# Patient Record
Sex: Female | Born: 1940 | Race: White | Hispanic: No | Marital: Married | Smoking: Never smoker
Health system: Southern US, Community
[De-identification: ages and names within clinical notes are randomized; demographics above are authoritative.]

## PROBLEM LIST (undated history)

## (undated) DIAGNOSIS — K219 Gastro-esophageal reflux disease without esophagitis: Secondary | ICD-10-CM

## (undated) DIAGNOSIS — E039 Hypothyroidism, unspecified: Secondary | ICD-10-CM

## (undated) HISTORY — PX: JOINT REPLACEMENT: SHX530

## (undated) HISTORY — PX: OTHER SURGICAL HISTORY: SHX169

---

## 2019-12-18 ENCOUNTER — Ambulatory Visit: Payer: Self-pay | Admitting: General Surgery

## 2019-12-18 ENCOUNTER — Other Ambulatory Visit: Payer: Self-pay | Admitting: General Surgery

## 2019-12-18 ENCOUNTER — Telehealth: Payer: Self-pay

## 2019-12-18 ENCOUNTER — Other Ambulatory Visit: Payer: Self-pay

## 2019-12-18 DIAGNOSIS — R17 Unspecified jaundice: Secondary | ICD-10-CM

## 2019-12-18 DIAGNOSIS — K8689 Other specified diseases of pancreas: Secondary | ICD-10-CM

## 2019-12-18 DIAGNOSIS — C250 Malignant neoplasm of head of pancreas: Secondary | ICD-10-CM

## 2019-12-18 NOTE — Telephone Encounter (Signed)
930 am [facility] Dr Ardis Hughs EUS ERCP 12/21/19.  COVID testing 12/29.  For painless jaundice and pancreatic mass per VO Dr Ardis Hughs. Call Dr Osborne Casco (son) tomorrow with appt information.  [phone], he will also be dropping off [place] CT and ERCP tomorrow.

## 2019-12-19 ENCOUNTER — Other Ambulatory Visit (HOSPITAL_COMMUNITY)
Admission: RE | Admit: 2019-12-19 | Discharge: 2019-12-19 | Disposition: A | Discharge status: Home / Self Care | Payer: Medicare Other | Source: Ambulatory Visit | Attending: Gastroenterology | Admitting: Gastroenterology

## 2019-12-19 ENCOUNTER — Ambulatory Visit
Admission: RE | Admit: 2019-12-19 | Discharge: 2019-12-19 | Disposition: A | Discharge status: Home / Self Care | Payer: Medicare Other | Source: Ambulatory Visit | Attending: General Surgery | Admitting: General Surgery

## 2019-12-19 ENCOUNTER — Encounter (HOSPITAL_COMMUNITY): Payer: Self-pay | Admitting: Gastroenterology

## 2019-12-19 DIAGNOSIS — Z01812 Encounter for preprocedural laboratory examination: Secondary | ICD-10-CM | POA: Insufficient documentation

## 2019-12-19 DIAGNOSIS — Z20828 Contact with and (suspected) exposure to other viral communicable diseases: Secondary | ICD-10-CM | POA: Insufficient documentation

## 2019-12-19 DIAGNOSIS — C250 Malignant neoplasm of head of pancreas: Secondary | ICD-10-CM

## 2019-12-19 LAB — SARS CORONAVIRUS 2 (TAT 6-24 HRS): SARS Coronavirus 2: NEGATIVE

## 2019-12-19 MED ORDER — IOPAMIDOL (ISOVUE-300) INJECTION 61%
75.0000 mL | Freq: Once | INTRAVENOUS | Status: AC | PRN
  Administered 2019-12-19: 75 mL via INTRAVENOUS

## 2019-12-19 NOTE — Telephone Encounter (Signed)
Summer Knight, Thanks for getting this started.  I spoke with Summer Knight, her husband and her son yesterday (Dr. Drenda Freeze). She has painless jaundice. Admitted briefly to a [place] hospital. Found to have T bili around 5, alk phos around 1000 and a 1.9cm mass in head of pancreas causing biliary obstruction.  ERCP attempt in [place] was unsuccessful and there were limited options where she lived for comprehensive care and so her son advised her to come up to [place] .  She needs EUS/ERCP on this Thursday with usually covid testing beforehand.  Please as her son to bring the [facility] paperwork AND the CT scan on disc with her to the EUS/ERCP appointment.  All the coordinating should be done through her son.  Thanks.

## 2019-12-19 NOTE — Telephone Encounter (Signed)
The pt's son Dr Osborne Casco has been advised of the appt and instructions for COVID testing.  He will bring the images on a disc to the appt on 12/31.  He will call with any further testing.

## 2019-12-20 NOTE — Anesthesia Preprocedure Evaluation (Addendum)
Anesthesia Evaluation  Patient identified by MRN, date of birth, ID band Patient awake    Reviewed: Allergy & Precautions, NPO status , Patient's Chart, lab work & pertinent test results  Airway Mallampati: II  TM Distance: >3 FB     Dental no notable dental hx. (+) Dental Advisory Given   Pulmonary neg pulmonary ROS,    Pulmonary exam normal        Cardiovascular negative cardio ROS Normal cardiovascular exam     Neuro/Psych negative neurological ROS     GI/Hepatic Neg liver ROS,  jaundice and panc mass   Endo/Other  negative endocrine ROS  Renal/GU negative Renal ROS     Musculoskeletal negative musculoskeletal ROS (+)   Abdominal   Peds  Hematology negative hematology ROS (+)   Anesthesia Other Findings Day of surgery medications reviewed with the patient.  Reproductive/Obstetrics                           Anesthesia Physical Anesthesia Plan  ASA: III  Anesthesia Plan: General   Post-op Pain Management:    Induction:   PONV Risk Score and Plan: 3 and Ondansetron, Dexamethasone and Midazolam  Airway Management Planned: Oral ETT  Additional Equipment: None  Intra-op Plan:   Post-operative Plan: Extubation in OR  Informed Consent: I have reviewed the patients History and Physical, chart, labs and discussed the procedure including the risks, benefits and alternatives for the proposed anesthesia with the patient or authorized representative who has indicated his/her understanding and acceptance.     Dental advisory given  Plan Discussed with: Anesthesiologist and CRNA  Anesthesia Plan Comments:       Anesthesia Quick Evaluation

## 2019-12-21 ENCOUNTER — Other Ambulatory Visit: Payer: Self-pay

## 2019-12-21 ENCOUNTER — Ambulatory Visit (HOSPITAL_COMMUNITY): Payer: Medicare Other

## 2019-12-21 ENCOUNTER — Ambulatory Visit (HOSPITAL_COMMUNITY): Payer: Medicare Other | Admitting: Certified Registered Nurse Anesthetist

## 2019-12-21 ENCOUNTER — Encounter (HOSPITAL_COMMUNITY): Payer: Self-pay | Admitting: Gastroenterology

## 2019-12-21 ENCOUNTER — Encounter (HOSPITAL_COMMUNITY)
Admission: RE | Disposition: A | Discharge status: Home / Self Care | Payer: Self-pay | Source: Home / Self Care | Attending: Internal Medicine

## 2019-12-21 ENCOUNTER — Inpatient Hospital Stay (HOSPITAL_COMMUNITY)
Admission: RE | Admit: 2019-12-21 | Discharge: 2019-12-23 | DRG: 435 | Disposition: A | Discharge status: Home / Self Care | Payer: Medicare Other | Attending: Internal Medicine | Admitting: Internal Medicine

## 2019-12-21 ENCOUNTER — Inpatient Hospital Stay (HOSPITAL_COMMUNITY): Payer: Medicare Other

## 2019-12-21 DIAGNOSIS — Z800 Family history of malignant neoplasm of digestive organs: Secondary | ICD-10-CM | POA: Diagnosis not present

## 2019-12-21 DIAGNOSIS — K8689 Other specified diseases of pancreas: Secondary | ICD-10-CM | POA: Diagnosis not present

## 2019-12-21 DIAGNOSIS — R7989 Other specified abnormal findings of blood chemistry: Secondary | ICD-10-CM

## 2019-12-21 DIAGNOSIS — Z79899 Other long term (current) drug therapy: Secondary | ICD-10-CM

## 2019-12-21 DIAGNOSIS — K831 Obstruction of bile duct: Secondary | ICD-10-CM | POA: Diagnosis present

## 2019-12-21 DIAGNOSIS — E039 Hypothyroidism, unspecified: Secondary | ICD-10-CM

## 2019-12-21 DIAGNOSIS — Z96651 Presence of right artificial knee joint: Secondary | ICD-10-CM | POA: Diagnosis present

## 2019-12-21 DIAGNOSIS — L299 Pruritus, unspecified: Secondary | ICD-10-CM | POA: Diagnosis present

## 2019-12-21 DIAGNOSIS — E785 Hyperlipidemia, unspecified: Secondary | ICD-10-CM | POA: Diagnosis present

## 2019-12-21 DIAGNOSIS — C259 Malignant neoplasm of pancreas, unspecified: Secondary | ICD-10-CM | POA: Diagnosis present

## 2019-12-21 DIAGNOSIS — R17 Unspecified jaundice: Secondary | ICD-10-CM | POA: Diagnosis not present

## 2019-12-21 DIAGNOSIS — Z20822 Contact with and (suspected) exposure to covid-19: Secondary | ICD-10-CM | POA: Diagnosis present

## 2019-12-21 DIAGNOSIS — R918 Other nonspecific abnormal finding of lung field: Secondary | ICD-10-CM | POA: Diagnosis not present

## 2019-12-21 DIAGNOSIS — K219 Gastro-esophageal reflux disease without esophagitis: Secondary | ICD-10-CM | POA: Diagnosis present

## 2019-12-21 DIAGNOSIS — I9589 Other hypotension: Secondary | ICD-10-CM | POA: Diagnosis present

## 2019-12-21 DIAGNOSIS — C251 Malignant neoplasm of body of pancreas: Secondary | ICD-10-CM | POA: Diagnosis not present

## 2019-12-21 DIAGNOSIS — C250 Malignant neoplasm of head of pancreas: Secondary | ICD-10-CM | POA: Diagnosis not present

## 2019-12-21 DIAGNOSIS — Z79890 Hormone replacement therapy (postmenopausal): Secondary | ICD-10-CM | POA: Diagnosis not present

## 2019-12-21 HISTORY — DX: Hypothyroidism, unspecified: E03.9

## 2019-12-21 HISTORY — PX: EUS: SHX5427

## 2019-12-21 HISTORY — PX: IR BILIARY DRAIN PLACEMENT WITH CHOLANGIOGRAM: IMG6043

## 2019-12-21 HISTORY — PX: ESOPHAGOGASTRODUODENOSCOPY (EGD) WITH PROPOFOL: SHX5813

## 2019-12-21 HISTORY — DX: Gastro-esophageal reflux disease without esophagitis: K21.9

## 2019-12-21 HISTORY — PX: FINE NEEDLE ASPIRATION: SHX5430

## 2019-12-21 HISTORY — PX: ENDOSCOPIC RETROGRADE CHOLANGIOPANCREATOGRAPHY (ERCP) WITH PROPOFOL: SHX5810

## 2019-12-21 LAB — COMPREHENSIVE METABOLIC PANEL
ALT: 578 U/L — ABNORMAL HIGH (ref 0–44)
AST: 269 U/L — ABNORMAL HIGH (ref 15–41)
Albumin: 2.5 g/dL — ABNORMAL LOW (ref 3.5–5.0)
Alkaline Phosphatase: 539 U/L — ABNORMAL HIGH (ref 38–126)
Anion gap: 11 (ref 5–15)
BUN: 13 mg/dL (ref 8–23)
CO2: 21 mmol/L — ABNORMAL LOW (ref 22–32)
Calcium: 8.1 mg/dL — ABNORMAL LOW (ref 8.9–10.3)
Chloride: 107 mmol/L (ref 98–111)
Creatinine, Ser: 0.52 mg/dL (ref 0.44–1.00)
GFR calc Af Amer: >60 mL/min (ref >60)
GFR calc non Af Amer: >60 mL/min (ref >60)
Glucose, Bld: 97 mg/dL (ref 70–99)
Potassium: 3.5 mmol/L (ref 3.5–5.1)
Sodium: 139 mmol/L (ref 135–145)
Total Bilirubin: 13.1 mg/dL — ABNORMAL HIGH (ref 0.3–1.2)
Total Protein: 5.1 g/dL — ABNORMAL LOW (ref 6.5–8.1)

## 2019-12-21 LAB — PROTIME-INR
INR: 1 (ref 0.8–1.2)
Prothrombin Time: 13.2 seconds (ref 11.4–15.2)

## 2019-12-21 SURGERY — UPPER ENDOSCOPIC ULTRASOUND (EUS) RADIAL
Anesthesia: General

## 2019-12-21 MED ORDER — SUGAMMADEX SODIUM 200 MG/2ML IV SOLN
INTRAVENOUS | Status: DC | PRN
  Administered 2019-12-21: 200 mg via INTRAVENOUS

## 2019-12-21 MED ORDER — ONDANSETRON HCL 4 MG/2ML IJ SOLN
INTRAMUSCULAR | Status: DC | PRN
  Administered 2019-12-21: 4 mg via INTRAVENOUS

## 2019-12-21 MED ORDER — LIDOCAINE HCL 1 % IJ SOLN
INTRAMUSCULAR | Status: AC
  Filled 2019-12-21: qty 20

## 2019-12-21 MED ORDER — PIPERACILLIN-TAZOBACTAM 3.375 G IVPB
INTRAVENOUS | Status: AC
  Administered 2019-12-21: 3.375 g via INTRAVENOUS
  Filled 2019-12-21: qty 50

## 2019-12-21 MED ORDER — FENTANYL CITRATE (PF) 100 MCG/2ML IJ SOLN
INTRAMUSCULAR | Status: AC
  Filled 2019-12-21: qty 2

## 2019-12-21 MED ORDER — INDOMETHACIN 50 MG RE SUPP
RECTAL | Status: DC | PRN
  Administered 2019-12-21: 100 mg via RECTAL

## 2019-12-21 MED ORDER — PHENYLEPHRINE 40 MCG/ML (10ML) SYRINGE FOR IV PUSH (FOR BLOOD PRESSURE SUPPORT)
PREFILLED_SYRINGE | INTRAVENOUS | Status: DC | PRN
  Administered 2019-12-21 (×2): 80 ug via INTRAVENOUS

## 2019-12-21 MED ORDER — PROMETHAZINE HCL 25 MG PO TABS: 12.5000 mg | ORAL_TABLET | Freq: Four times a day (QID) | ORAL | Status: DC | PRN

## 2019-12-21 MED ORDER — CIPROFLOXACIN IN D5W 400 MG/200ML IV SOLN
INTRAVENOUS | Status: AC
  Filled 2019-12-21: qty 200

## 2019-12-21 MED ORDER — DOCUSATE SODIUM 100 MG PO CAPS
100.0000 mg | ORAL_CAPSULE | Freq: Two times a day (BID) | ORAL | Status: DC
  Administered 2019-12-22: 100 mg via ORAL
  Filled 2019-12-21 (×3): qty 1

## 2019-12-21 MED ORDER — MIDODRINE HCL 5 MG PO TABS
5.0000 mg | ORAL_TABLET | Freq: Three times a day (TID) | ORAL | Status: DC
  Administered 2019-12-21 – 2019-12-23 (×5): 5 mg via ORAL
  Filled 2019-12-21 (×5): qty 1

## 2019-12-21 MED ORDER — LACTATED RINGERS IV SOLN: INTRAVENOUS | Status: DC

## 2019-12-21 MED ORDER — IOHEXOL 300 MG/ML  SOLN
50.0000 mL | Freq: Once | INTRAMUSCULAR | Status: AC | PRN
  Administered 2019-12-21: 20 mL

## 2019-12-21 MED ORDER — GLUCAGON HCL RDNA (DIAGNOSTIC) 1 MG IJ SOLR
INTRAMUSCULAR | Status: AC
  Filled 2019-12-21: qty 1

## 2019-12-21 MED ORDER — RALOXIFENE HCL 60 MG PO TABS
60.0000 mg | ORAL_TABLET | Freq: Every evening | ORAL | Status: DC
  Administered 2019-12-21 – 2019-12-22 (×2): 60 mg via ORAL
  Filled 2019-12-21 (×2): qty 1

## 2019-12-21 MED ORDER — IBUPROFEN 400 MG PO TABS: 400.0000 mg | ORAL_TABLET | Freq: Four times a day (QID) | ORAL | Status: DC | PRN

## 2019-12-21 MED ORDER — LIDOCAINE HCL 1 % IJ SOLN
INTRAMUSCULAR | Status: AC | PRN
  Administered 2019-12-21: 10 mL

## 2019-12-21 MED ORDER — OXYCODONE HCL 5 MG PO TABS
5.0000 mg | ORAL_TABLET | ORAL | Status: DC | PRN
  Administered 2019-12-21: 5 mg via ORAL
  Filled 2019-12-21: qty 1

## 2019-12-21 MED ORDER — LIDOCAINE 2% (20 MG/ML) 5 ML SYRINGE
INTRAMUSCULAR | Status: DC | PRN
  Administered 2019-12-21: 60 mg via INTRAVENOUS

## 2019-12-21 MED ORDER — POLYVINYL ALCOHOL 1.4 % OP SOLN: 1.0000 [drp] | Freq: Three times a day (TID) | OPHTHALMIC | Status: DC | PRN

## 2019-12-21 MED ORDER — MIDAZOLAM HCL 2 MG/2ML IJ SOLN
INTRAMUSCULAR | Status: AC | PRN
  Administered 2019-12-21 (×4): 1 mg via INTRAVENOUS

## 2019-12-21 MED ORDER — SODIUM CHLORIDE 0.9 % IV SOLN: INTRAVENOUS | Status: DC

## 2019-12-21 MED ORDER — ROCURONIUM BROMIDE 10 MG/ML (PF) SYRINGE
PREFILLED_SYRINGE | INTRAVENOUS | Status: DC | PRN
  Administered 2019-12-21: 20 mg via INTRAVENOUS
  Administered 2019-12-21: 60 mg via INTRAVENOUS

## 2019-12-21 MED ORDER — DEXAMETHASONE SODIUM PHOSPHATE 4 MG/ML IJ SOLN
INTRAMUSCULAR | Status: DC | PRN
  Administered 2019-12-21: 5 mg via INTRAVENOUS

## 2019-12-21 MED ORDER — SODIUM CHLORIDE 0.9 % IV SOLN: Freq: Once | INTRAVENOUS | Status: AC

## 2019-12-21 MED ORDER — LEVOTHYROXINE SODIUM 88 MCG PO TABS
88.0000 ug | ORAL_TABLET | Freq: Every day | ORAL | Status: DC
  Administered 2019-12-22 – 2019-12-23 (×2): 88 ug via ORAL
  Filled 2019-12-21 (×2): qty 1

## 2019-12-21 MED ORDER — EPHEDRINE SULFATE-NACL 50-0.9 MG/10ML-% IV SOSY
PREFILLED_SYRINGE | INTRAVENOUS | Status: DC | PRN
  Administered 2019-12-21: 5 mg via INTRAVENOUS

## 2019-12-21 MED ORDER — PROPOFOL 10 MG/ML IV BOLUS
INTRAVENOUS | Status: AC
  Filled 2019-12-21: qty 20

## 2019-12-21 MED ORDER — FENTANYL CITRATE (PF) 100 MCG/2ML IJ SOLN
INTRAMUSCULAR | Status: AC | PRN
  Administered 2019-12-21: 50 ug via INTRAVENOUS

## 2019-12-21 MED ORDER — PIPERACILLIN-TAZOBACTAM 3.375 G IVPB 30 MIN
3.3750 g | Freq: Once | INTRAVENOUS | Status: AC
  Filled 2019-12-21: qty 50

## 2019-12-21 MED ORDER — SODIUM CHLORIDE 0.9 % IV SOLN
INTRAVENOUS | Status: DC | PRN
  Administered 2019-12-21: 50 mL

## 2019-12-21 MED ORDER — PROPOFOL 10 MG/ML IV BOLUS
INTRAVENOUS | Status: DC | PRN
  Administered 2019-12-21: 50 mg via INTRAVENOUS
  Administered 2019-12-21: 150 mg via INTRAVENOUS

## 2019-12-21 MED ORDER — LIDOCAINE HCL (PF) 1 % IJ SOLN
INTRAMUSCULAR | Status: AC
  Filled 2019-12-21: qty 30

## 2019-12-21 MED ORDER — PANTOPRAZOLE SODIUM 40 MG IV SOLR
40.0000 mg | INTRAVENOUS | Status: DC
  Administered 2019-12-21 – 2019-12-22 (×2): 40 mg via INTRAVENOUS
  Filled 2019-12-21 (×2): qty 40

## 2019-12-21 MED ORDER — INDOMETHACIN 50 MG RE SUPP
RECTAL | Status: AC
  Filled 2019-12-21: qty 2

## 2019-12-21 MED ORDER — FENTANYL CITRATE (PF) 100 MCG/2ML IJ SOLN
INTRAMUSCULAR | Status: DC | PRN
  Administered 2019-12-21: 100 ug via INTRAVENOUS

## 2019-12-21 MED ORDER — MIDAZOLAM HCL 2 MG/2ML IJ SOLN
INTRAMUSCULAR | Status: AC
  Filled 2019-12-21: qty 4

## 2019-12-21 NOTE — Anesthesia Postprocedure Evaluation (Signed)
Anesthesia Post Note  Patient: Meenakshi Cozzens  Procedure(s) Performed: UPPER ENDOSCOPIC ULTRASOUND (EUS) RADIAL (N/A ) ENDOSCOPIC RETROGRADE CHOLANGIOPANCREATOGRAPHY (ERCP) WITH PROPOFOL (N/A )     Patient location during evaluation: Endoscopy Anesthesia Type: General Level of consciousness: sedated Pain management: pain level controlled Vital Signs Assessment: post-procedure vital signs reviewed and stable Respiratory status: spontaneous breathing and respiratory function stable Cardiovascular status: stable Postop Assessment: no apparent nausea or vomiting Anesthetic complications: no    Last Vitals:  Vitals:   12/21/19 1140 12/21/19 1215  BP: (!) 144/55 130/78  Pulse:  69  Resp: 18 13  Temp:  36.9 C  SpO2:  100%    Last Pain:  Vitals:   12/21/19 1215  TempSrc: Oral  PainSc: 0-No pain                 Tanish Sinkler DANIEL

## 2019-12-21 NOTE — Transfer of Care (Signed)
Immediate Anesthesia Transfer of Care Note  Patient: Summer Knight  Procedure(s) Performed: UPPER ENDOSCOPIC ULTRASOUND (EUS) RADIAL (N/A ) ENDOSCOPIC RETROGRADE CHOLANGIOPANCREATOGRAPHY (ERCP) WITH PROPOFOL (N/A )  Patient Location: PACU  Anesthesia Type:General  Level of Consciousness: awake, alert  and oriented  Airway & Oxygen Therapy: Patient Spontanous Breathing and Patient connected to face mask oxygen  Post-op Assessment: Report given to RN and Post -op Vital signs reviewed and stable  Post vital signs: Reviewed and stable  Last Vitals:  Vitals Value Taken Time  BP    Temp    Pulse    Resp    SpO2      Last Pain:  Vitals:   12/21/19 0820  TempSrc: Oral  PainSc: 0-No pain         Complications: No apparent anesthesia complications

## 2019-12-21 NOTE — Consult Note (Addendum)
Chief Complaint: Patient was seen in consultation today for obstructive jaundice/internal external biliary drain placement.  Referring Physician(s): Milus Banister  Supervising Physician: Markus Daft  Patient Status: [name]  History of Present Illness: Summer Knight is a 78 y.o. female with an unremarkable past medical history who was unfortunately recently diagnosed with pancreatic cancer (adenocarcinoma) confirmed on EUS. Patient is from [facility]. She underwent attempted ERCP in [place] 12/11/2019, ERCP aborted secondary to obstruction. Patient then traveled to [facility] where her son is an Administrator, Civil Service with [employer]. She presented to [facility] today for an EUS with ERCP with Dr. Ardis Hughs. ERCP was aborted secondary to inability to pass obstruction.  IR requested by Dr. Ardis Hughs for possible image-guided percutaneous transhepatic cholangiogram with internal/external biliary drain placement. Patient awake and alert laying in bed with no complaints at this time. Accompanied by husband at bedside. Denies fever, chills, chest pain, dyspnea, abdominal pain, or headache.   History reviewed. No pertinent past medical history.  History reviewed. No pertinent surgical history.  Allergies: Patient has no known allergies.  Medications: Prior to Admission medications   Medication Sig Start Date End Date Taking? Authorizing Provider  atorvastatin (LIPITOR) 20 MG tablet Take 20 mg by mouth every evening. 11/24/19  Yes [provider]  Calcium Carb-Cholecalciferol (CALTRATE 600+D3 PO) Take 1 tablet by mouth daily.   Yes [provider]  famotidine (PEPCID AC) 10 MG tablet Take 10 mg by mouth daily.   Yes [provider]  levothyroxine (SYNTHROID) 88 MCG tablet Take 88 mcg by mouth daily before breakfast. 10/16/19  Yes [provider]  midodrine (PROAMATINE) 5 MG tablet Take 5 mg by mouth 3 (three) times daily. 10/02/19  Yes [provider]  Polyethyl Glycol-Propyl  Glycol (LUBRICANT EYE DROPS) 0.4-0.3 % SOLN Place 1 drop into both eyes 3 (three) times daily as needed (dry/irritated eyes.).   Yes [provider]  raloxifene (EVISTA) 60 MG tablet Take 60 mg by mouth every evening. 11/24/19  Yes [provider]     History reviewed. No pertinent family history.  Social History   Socioeconomic History  . Marital status: Married    Spouse name: Not on file  . Number of children: Not on file  . Years of education: Not on file  . Highest education level: Not on file  Occupational History  . Not on file  Tobacco Use  . Smoking status: Not on file  Substance and Sexual Activity  . Alcohol use: Not on file  . Drug use: Not on file  . Sexual activity: Not on file  Other Topics Concern  . Not on file  Social History Narrative  . Not on file   Social Determinants of Health   Financial Resource Strain:   . Difficulty of Paying Living Expenses: Not on file  Food Insecurity:   . Worried About Charity fundraiser in the Last Year: Not on file  . Ran Out of Food in the Last Year: Not on file  Transportation Needs:   . Lack of Transportation (Medical): Not on file  . Lack of Transportation (Non-Medical): Not on file  Physical Activity:   . Days of Exercise per Week: Not on file  . Minutes of Exercise per Session: Not on file  Stress:   . Feeling of Stress : Not on file  Social Connections:   . Frequency of Communication with Friends and Family: Not on file  . Frequency of Social Gatherings with Friends and  Family: Not on file  . Attends Religious Services: Not on file  . Active Member of Clubs or Organizations: Not on file  . Attends Archivist Meetings: Not on file  . Marital Status: Not on file     Review of Systems: A 12 point ROS discussed and pertinent positives are indicated in the HPI above.  All other systems are negative.  Review of Systems  Constitutional: Negative for chills and fever.  Respiratory:  Negative for shortness of breath and wheezing.   Cardiovascular: Negative for chest pain and palpitations.  Gastrointestinal: Negative for abdominal pain.  Skin: Positive for color change.  Neurological: Negative for headaches.  Psychiatric/Behavioral: Negative for behavioral problems and confusion.    Vital Signs: BP 130/78 (BP Location: Left Arm)   Pulse 69   Temp 98.4 F (36.9 C) (Oral)   Resp 13   Ht 4\' 10"  (1.473 m)   Wt 172 lb (78 kg)   SpO2 100%   BMI 35.95 kg/m   Physical Exam Vitals and nursing note reviewed.  Constitutional:      General: She is not in acute distress.    Appearance: Normal appearance.  Cardiovascular:     Rate and Rhythm: Normal rate and regular rhythm.     Heart sounds: Normal heart sounds. No murmur.  Pulmonary:     Effort: Pulmonary effort is normal. No respiratory distress.     Breath sounds: Normal breath sounds. No wheezing.  Skin:    General: Skin is warm and dry.     Coloration: Skin is jaundiced.  Neurological:     Mental Status: She is alert.  Psychiatric:        Mood and Affect: Mood normal.        Behavior: Behavior normal.      MD Evaluation Airway: WNL Heart: WNL Abdomen: WNL Chest/ Lungs: WNL Other Pertinent Findings: jaundiced ASA  Classification: 3 Mallampati/Airway Score: Two   Imaging: CT CHEST W CONTRAST  Result Date: 12/19/2019 CLINICAL DATA:  Recent diagnosis pancreatic cancer, evaluate for metastatic disease EXAM: CT CHEST WITH CONTRAST TECHNIQUE: Multidetector CT imaging of the chest was performed during intravenous contrast administration. CONTRAST:  44mL ISOVUE-300 IOPAMIDOL (ISOVUE-300) INJECTION 61% COMPARISON:  None. FINDINGS: Cardiovascular: Minimal aortic atherosclerosis. Normal heart size. No pericardial effusion. Mediastinum/Nodes: No enlarged mediastinal, hilar, or axillary lymph nodes. Small hiatal hernia. Thyroid gland, trachea, and esophagus demonstrate no significant findings. Lungs/Pleura:  Dependent bibasilar scarring and/or atelectasis. 3 mm nodule of the superior segment left lower lobe (series 8, image 53). 3 mm nodule of the dependent right lower lobe (series 8, image 58). No pleural effusion or pneumothorax. Upper Abdomen: No acute abnormality. Biliary and pancreatic ductal dilatation as well as gallbladder distension in the partially included upper abdomen. Cholelithiasis. Small 7 mm hyperenhancing lesion of the liver dome (series 2, image 87). Musculoskeletal: No chest wall mass or suspicious bone lesions identified. Disc degenerative disease of the thoracic spine IMPRESSION: 1. There is a 3 mm nodule of the superior segment left lower lobe (series 8, image 53) and a 3 mm nodule of the dependent right lower lobe (series 8, image 58). These are nonspecific and favored to represent incidental sequelae of prior infection or inflammation. Metastatic disease is less favored although not excluded. Attention on follow-up. 2. Biliary and pancreatic ductal dilatation as well as gallbladder distension in the partially included upper abdomen, findings in keeping with reported diagnosis of pancreatic cancer. 3. Aortic Atherosclerosis (ICD10-I70.0). Electronically Signed   By:  Eddie Candle M.D.   On: 12/19/2019 16:38   DG C-Arm 1-60 Min-No Report  Result Date: 12/21/2019 Fluoroscopy was utilized by the requesting physician.  No radiographic interpretation.    Labs:  CBC: No results for input(s): WBC, HGB, HCT, PLT in the last 8760 hours.  COAGS: No results for input(s): INR, APTT in the last 8760 hours.  BMP: Recent Labs    12/21/19 0855  NA 139  K 3.5  CL 107  CO2 21*  GLUCOSE 97  BUN 13  CALCIUM 8.1*  CREATININE 0.52  GFRNONAA >60  GFRAA >60    LIVER FUNCTION TESTS: Recent Labs    12/21/19 0855  BILITOT 13.1*  AST 269*  ALT 578*  ALKPHOS 539*  PROT 5.1*  ALBUMIN 2.5*     Assessment and Plan:  Obstructive jaundice secondary to pancreatic mass s/p EUS with  attempted ERCP (unable to cross obstruction so procedure was aborted). Plan for image-guided percutaneous transhepatic cholangiogram with internal/external biliary drain placement tentatively for today in IR. Patient is NPO. Afebrile. She does not take blood thinners. INR pending.  Procedure was discussed in detail by Dr. Anselm Pancoast and myself to both patient and husband. Risks and benefits of internal/external biliary drain placement discussed with the patient including, but not limited to bleeding, infection which may lead to sepsis or even death and damage to adjacent structures. This interventional procedure involves the use of X-rays and because of the nature of the planned procedure, it is possible that we will have prolonged use of X-ray fluoroscopy. Potential radiation risks to you include (but are not limited to) the following: - A slightly elevated risk for cancer  several years later in life. This risk is typically less than 0.5% percent. This risk is low in comparison to the normal incidence of human cancer, which is 33% for women and 50% for men according to the [place]. - Radiation induced injury can include skin redness, resembling a rash, tissue breakdown / ulcers and hair loss (which can be temporary or permanent).  The likelihood of either of these occurring depends on the difficulty of the procedure and whether you are sensitive to radiation due to previous procedures, disease, or genetic conditions.  IF your procedure requires a prolonged use of radiation, you will be notified and given written instructions for further action.  It is your responsibility to monitor the irradiated area for the 2 weeks following the procedure and to notify your physician if you are concerned that you have suffered a radiation induced injury.  All of the patient's and patient's husbands' questions were answered. Consent was obtained by patient's husband as she was under general anesthesia this  AM- signed and in chart.   Thank you for this interesting consult.  I greatly enjoyed meeting Summer Knight and look forward to participating in their care.  A copy of this report was sent to the requesting provider on this date.  Electronically Signed: Earley Abide, PA-C 12/21/2019, 1:21 PM   I spent a total of 40 Minutes in face to face in clinical consultation, greater than 50% of which was counseling/coordinating care for obstructive jaundice/internal external biliary drain placement.

## 2019-12-21 NOTE — Procedures (Signed)
Interventional Radiology Procedure:   Indications: Pancreatic cancer and obstructive jaundice  Procedure: PTC and placement of internal/external biliary drain  Findings: Distal common bile duct obstruction.  Distended gallbladder.  Drain placed in right biliary duct.  10 Fr drain transverses the obstruction and tip in duodenum.  Complications: none     EBL: less than 10 ml  Plan: Follow output and labs.  Return to inpatient floor.    Adam R. Henn, MD  Pager: [phone]      

## 2019-12-21 NOTE — Telephone Encounter (Signed)
Summer Knight from [place] stated that pt is there for admission, but there is no admission order.

## 2019-12-21 NOTE — Op Note (Signed)
[facility] Patient Name: Summer Knight Procedure Date: 12/21/2019 MRN: [id number] Attending MD: Milus Banister , MD Date of Birth: [birth year] CSN: [id number] Age: 78 Admit Type: Outpatient Procedure:                Upper EUS Indications:              presented with painless jaundice, pruritis 2 weeks                            ago to [facility]. Imaging showed 1.9cm mass                            in head of pancreas causing biliary and pancreatic                            duct obstruction. TBili around 6. Incomplete ERCP                            in [place]. Mother died of pancreatic cancer. Providers:                Milus Banister, MD, Baird Cancer, RN, Cletis Athens, Technician Referring MD:             Stark Klein, MD Medicines:                General Anesthesia Complications:            No immediate complications. Estimated blood loss:                            None. Estimated Blood Loss:     Estimated blood loss: none. Procedure:                Pre-Anesthesia Assessment:                           - Prior to the procedure, a History and Physical                            was performed, and patient medications and                            allergies were reviewed. The patient's tolerance of                            previous anesthesia was also reviewed. The risks                            and benefits of the procedure and the sedation                            options and risks were discussed with the patient.  All questions were answered, and informed consent                            was obtained. Prior Anticoagulants: The patient has                            taken no previous anticoagulant or antiplatelet                            agents. ASA Grade Assessment: II - A patient with                            mild systemic disease. After reviewing the risks                            and  benefits, the patient was deemed in                            satisfactory condition to undergo the procedure.                           After obtaining informed consent, the endoscope was                            passed under direct vision. Throughout the                            procedure, the patient's blood pressure, pulse, and                            oxygen saturations were monitored continuously. The                            [id number]) Olympus Linear EUS was                            introduced through the mouth, and advanced to the                            second part of duodenum. The was introduced through                            the mouth, and advanced to the second part of                            duodenum. The upper EUS was accomplished without                            difficulty. The patient tolerated the procedure                            well. Findings:      ENDOSCOPIC FINDING (limited views with radial and linear       echoendoscopes): :  The examined esophagus was endoscopically normal.      The entire examined stomach was endoscopically normal.      The examined duodenum was endoscopically normal.      ENDOSONOGRAPHIC FINDING: :      1. A round mass was identified in the pancreatic head. The mass was       hypoechoic and heterogenous. The mass measured 18 mm by 17 mm in maximal       cross-sectional diameter. The endosonographic borders were       poorly-defined. There was sonographic evidence suggesting invasion into       the superior mesenteric vein (manifested by direct abutment). The       remainder of the pancreas was examined. The main pancreatic duct was       dilated to 78mm in the body and tail. Fine needle aspiration for       cytology was performed. Color Doppler imaging was utilized prior to       needle puncture to confirm a lack of significant vascular structures       within the needle path. Three passes were made with  the 22 gauge needle       using a transduodenal approach. A cytotechnologist was present to       evaluate the adequacy of the specimen.      2. No peripancreatic adenopathy.      3. The CBD was dilated to 38mm, tapering abruptly at the site of the       pancreatic head mass.      4. Limited views of the liver, spleen, gallbladder were normal. Impression:               - 1.8cm round, irregularly bordered mass in the                            head of pancreas causing main pancreatic duct and                            biliary duct dilation. The mass directly abuts the                            SMV for about 5-54mm, suggesting possible invasion.                            The mass was sampled by FNA and the preliminary                            cytology review was positive for malignancy                            (adenocarcinoma). Moderate Sedation:      Not Applicable - Patient had care per Anesthesia. Recommendation:           - ERCP now to decompress the bile duct.                           - Await final pathology results. Procedure Code(s):        --- Professional ---  43238, Esophagogastroduodenoscopy, flexible,                            transoral; with transendoscopic ultrasound-guided                            intramural or transmural fine needle                            aspiration/biopsy(s), (includes endoscopic                            ultrasound examination limited to the esophagus,                            stomach or duodenum, and adjacent structures) Diagnosis Code(s):        --- Professional ---                           K86.89, Other specified diseases of pancreas                           R93.3, Abnormal findings on diagnostic imaging of                            other parts of digestive tract CPT copyright 2019 American Medical Association. All rights reserved. The codes documented in this report are preliminary and upon coder review  may  be revised to meet current compliance requirements. Milus Banister, MD 12/21/2019 9:52:34 AM This report has been signed electronically. Number of Addenda: 0

## 2019-12-21 NOTE — Telephone Encounter (Signed)
I spoke with Summer Knight and advised that she call [facility] endo for orders.  I also called [facility] endo and advised them that she is looking for orders.

## 2019-12-21 NOTE — H&P (Signed)
HPI: This is a 78 yo woman, presented to [place] hospital with pruritis, painless jaundice. Workup showed TB around 6, lipase 800s, 1.9cm "ill defined obstructing mass" in the pancreatic head. CBD 1.4cm, PD also dilated.  ERCP attempt Dr. Nelva Bush 12/21 unable to cannulate the bile duct with a wire however he noted duodenal ulcerations and cholangiogram defined a distal CBD stricture that he felt was malignant appearing.  Outside CT report does not mention if the panc head mass involves any significant nearby vessels.  She traveled up to [facility] where her son is an Administrator, Civil Service with [employer].  She already met Dr. Barry Dienes. CT of the chest two days ago showed no obvious metastatic disease in the chest.   ROS: complete GI ROS as described in HPI, all other review negative.  Constitutional:  No unintentional weight loss   History reviewed. No pertinent past medical history.  History reviewed. No pertinent surgical history.  Current Facility-Administered Medications  Medication Dose Route Frequency Provider Last Rate Last Admin  . 0.9 %  sodium chloride infusion   Intravenous Continuous Milus Banister, MD      . lactated ringers infusion   Intravenous Continuous Milus Banister, MD 20 mL/hr at 12/21/19 1610 Restarted at 12/21/19 9604    Allergies as of 12/18/2019  . (Not on File)    History reviewed. No pertinent family history.  Social History   Socioeconomic History  . Marital status: Married    Spouse name: Not on file  . Number of children: Not on file  . Years of education: Not on file  . Highest education level: Not on file  Occupational History  . Not on file  Tobacco Use  . Smoking status: Not on file  Substance and Sexual Activity  . Alcohol use: Not on file  . Drug use: Not on file  . Sexual activity: Not on file  Other Topics Concern  . Not on file  Social History Narrative  . Not on file   Social Determinants of Health   Financial Resource Strain:   .  Difficulty of Paying Living Expenses: Not on file  Food Insecurity:   . Worried About Charity fundraiser in the Last Year: Not on file  . Ran Out of Food in the Last Year: Not on file  Transportation Needs:   . Lack of Transportation (Medical): Not on file  . Lack of Transportation (Non-Medical): Not on file  Physical Activity:   . Days of Exercise per Week: Not on file  . Minutes of Exercise per Session: Not on file  Stress:   . Feeling of Stress : Not on file  Social Connections:   . Frequency of Communication with Friends and Family: Not on file  . Frequency of Social Gatherings with Friends and Family: Not on file  . Attends Religious Services: Not on file  . Active Member of Clubs or Organizations: Not on file  . Attends Archivist Meetings: Not on file  . Marital Status: Not on file  Intimate Partner Violence:   . Fear of Current or Ex-Partner: Not on file  . Emotionally Abused: Not on file  . Physically Abused: Not on file  . Sexually Abused: Not on file     Physical Exam: BP (!) 123/59   Pulse 72   Resp 17   Ht '4\' 10"'  (1.473 m)   Wt 78 kg   SpO2 100%   BMI 35.95 kg/m  Constitutional: generally well-appearing Psychiatric: alert  and oriented x3 Abdomen: soft, nontender, nondistended, no obvious ascites, no peritoneal signs, normal bowel sounds No peripheral edema noted in lower extremities  Assessment and plan: 78 y.o. female with mass in head of pancreas causing obstructive jaundice  For upper EUS and ERCP today  Please see the "Patient Instructions" section for addition details about the plan.  Owens Loffler, MD [facility] 12/21/2019, 8:51 AM

## 2019-12-21 NOTE — Progress Notes (Signed)
Telephone encounter with MD Marylyn Ishihara and confirmed he was pts admitting physician.  He is aware of pt admission to med surg floor and need for admission orders.  Spoke with RN Miquel Dunn and informed her MD Marylyn Ishihara will place orders for pt as soon as possible.  RN Miquel Dunn will continue to monitor.

## 2019-12-21 NOTE — Anesthesia Procedure Notes (Signed)
Procedure Name: Intubation Date/Time: 12/21/2019 9:06 AM Performed by: Claudia Desanctis, CRNA Pre-anesthesia Checklist: Patient identified, Emergency Drugs available, Suction available and Patient being monitored Patient Re-evaluated:Patient Re-evaluated prior to induction Oxygen Delivery Method: Circle system utilized Preoxygenation: Pre-oxygenation with 100% oxygen Induction Type: IV induction Ventilation: Mask ventilation without difficulty Laryngoscope Size: 2 and Miller Grade View: Grade I Tube type: Oral Tube size: 7.0 mm Number of attempts: 1 Airway Equipment and Method: Stylet Placement Confirmation: ETT inserted through vocal cords under direct vision,  positive ETCO2 and breath sounds checked- equal and bilateral Secured at: 20 cm Tube secured with: Tape Dental Injury: Teeth and Oropharynx as per pre-operative assessment  Comments: Slight amount laryngeal pressure used to enhance view of cords, lips and teeth unchanged

## 2019-12-21 NOTE — H&P (Signed)
.  History and Physical    Summer Knight [id number] DOB: 22-Apr-1941 DOA: 12/21/2019  PCP: Prince Solian, MD  Patient coming from: Home   Chief Complaint: Summer Knight  HPI: Summer Knight is a 77 y.o. female with medical history significant of GERD, hypothyroidism. Presents with several weeks of pruritis and juandice. She was at home in [facility] and became concerned about her condition. She was see by her PCP team and they recommended that she get some tests run. They found that she had hyperbilirubinemia and recommended that she ERCP performed. That procedure was not successful. So, her family recommended that she come to [facility] to have the procedure run here. She was seen by GI. ERCP was again unsuccessful; however, an EUS was positive for a pancreatic mass. GI discussed the situation with IR, who will place a drain today. TRH was called for admission. She denies any aggravating or alleviating factors. She denies any previous history of cancer.   Review of Systems: She reports jaundice, pruritis, decreased appetite. Denies pain. Remainder of 10 point review of systems is otherwise negative for all not mentioned in HPI.    Past Medical History:  Diagnosis Date  . GERD (gastroesophageal reflux disease)   . Hypothyroidism     Past Surgical History:  Procedure Laterality Date  . JOINT REPLACEMENT     Right Knee  . Lipoma removed from left arm    . right breast biopsy  1980's     reports that she has never smoked. She has never used smokeless tobacco. She reports that she does not drink alcohol or use drugs.  No Known Allergies  History reviewed. No pertinent family history.  Prior to Admission medications   Medication Sig Start Date End Date Taking? Authorizing Provider  atorvastatin (LIPITOR) 20 MG tablet Take 20 mg by mouth every evening. 11/24/19  Yes [provider]  Calcium Carb-Cholecalciferol (CALTRATE 600+D3 PO) Take 1 tablet by mouth daily.   Yes [provider]  famotidine (PEPCID AC) 10 MG tablet Take 10 mg by mouth daily.   Yes [provider]  levothyroxine (SYNTHROID) 88 MCG tablet Take 88 mcg by mouth daily before breakfast. 10/16/19  Yes [provider]  midodrine (PROAMATINE) 5 MG tablet Take 5 mg by mouth 3 (three) times daily. 10/02/19  Yes [provider]  Polyethyl Glycol-Propyl Glycol (LUBRICANT EYE DROPS) 0.4-0.3 % SOLN Place 1 drop into both eyes 3 (three) times daily as needed (dry/irritated eyes.).   Yes [provider]  raloxifene (EVISTA) 60 MG tablet Take 60 mg by mouth every evening. 11/24/19  Yes [provider]    Physical Exam: Vitals:   12/21/19 1112 12/21/19 1131 12/21/19 1140 12/21/19 1215  BP: 138/76 (!) 156/82 (!) 144/55 130/78  Pulse: 69 (!) 56  69  Resp: 15 19 18 13   Temp: 97.8 F (36.6 C)   98.4 F (36.9 C)  TempSrc: Temporal   Oral  SpO2: 100% 98%  100%  Weight:      Height:        Constitutional: 78 y.o. female NAD, calm, comfortable Vitals:   12/21/19 1112 12/21/19 1131 12/21/19 1140 12/21/19 1215  BP: 138/76 (!) 156/82 (!) 144/55 130/78  Pulse: 69 (!) 56  69  Resp: 15 19 18 13   Temp: 97.8 F (36.6 C)   98.4 F (36.9 C)  TempSrc: Temporal   Oral  SpO2: 100% 98%  100%  Weight:      Height:  General: 78 y.o. female resting in bed in NAD Eyes: PERRL, icteric sclera ENMT: Nares patent w/o discharge, orophaynx clear, dentition normal, ears w/o discharge/lesions/ulcers Cardiovascular: RRR, +S1, S2, no m/g/r, equal pulses throughout Respiratory: CTABL, no w/r/r, normal WOB GI: BS+, NDNT, no masses noted, no organomegaly noted MSK: No e/c/c Skin: No rashes, bruises, ulcerations noted; jaundice noted Neuro: A&O x 3, no focal deficits Psyc: Appropriate interaction and affect, calm/cooperative   Labs on Admission: I have personally reviewed following labs and imaging studies  CBC: No results for input(s): WBC, NEUTROABS, HGB, HCT, MCV,  PLT in the last 168 hours. Basic Metabolic Panel: Recent Labs  Lab 12/21/19 0855  NA 139  K 3.5  CL 107  CO2 21*  GLUCOSE 97  BUN 13  CREATININE 0.52  CALCIUM 8.1*   GFR: Estimated Creatinine Clearance: 52.6 mL/min (by C-G formula based on SCr of 0.52 mg/dL). Liver Function Tests: Recent Labs  Lab 12/21/19 0855  AST 269*  ALT 578*  ALKPHOS 539*  BILITOT 13.1*  PROT 5.1*  ALBUMIN 2.5*   No results for input(s): LIPASE, AMYLASE in the last 168 hours. No results for input(s): AMMONIA in the last 168 hours. Coagulation Profile: No results for input(s): INR, PROTIME in the last 168 hours. Cardiac Enzymes: No results for input(s): CKTOTAL, CKMB, CKMBINDEX, TROPONINI in the last 168 hours. BNP (last 3 results) No results for input(s): PROBNP in the last 8760 hours. HbA1C: No results for input(s): HGBA1C in the last 72 hours. CBG: No results for input(s): GLUCAP in the last 168 hours. Lipid Profile: No results for input(s): CHOL, HDL, LDLCALC, TRIG, CHOLHDL, LDLDIRECT in the last 72 hours. Thyroid Function Tests: No results for input(s): TSH, T4TOTAL, FREET4, T3FREE, THYROIDAB in the last 72 hours. Anemia Panel: No results for input(s): VITAMINB12, FOLATE, FERRITIN, TIBC, IRON, RETICCTPCT in the last 72 hours. Urine analysis: No results found for: COLORURINE, APPEARANCEUR, LABSPEC, [place], GLUCOSEU, [place], [place], [place], PROTEINUR, UROBILINOGEN, NITRITE, LEUKOCYTESUR  Radiological Exams on Admission: CT CHEST W CONTRAST  Result Date: 12/19/2019 CLINICAL DATA:  Recent diagnosis pancreatic cancer, evaluate for metastatic disease EXAM: CT CHEST WITH CONTRAST TECHNIQUE: Multidetector CT imaging of the chest was performed during intravenous contrast administration. CONTRAST:  35mL ISOVUE-300 IOPAMIDOL (ISOVUE-300) INJECTION 61% COMPARISON:  None. FINDINGS: Cardiovascular: Minimal aortic atherosclerosis. Normal heart size. No pericardial effusion. Mediastinum/Nodes: No  enlarged mediastinal, hilar, or axillary lymph nodes. Small hiatal hernia. Thyroid gland, trachea, and esophagus demonstrate no significant findings. Lungs/Pleura: Dependent bibasilar scarring and/or atelectasis. 3 mm nodule of the superior segment left lower lobe (series 8, image 53). 3 mm nodule of the dependent right lower lobe (series 8, image 58). No pleural effusion or pneumothorax. Upper Abdomen: No acute abnormality. Biliary and pancreatic ductal dilatation as well as gallbladder distension in the partially included upper abdomen. Cholelithiasis. Small 7 mm hyperenhancing lesion of the liver dome (series 2, image 87). Musculoskeletal: No chest wall mass or suspicious bone lesions identified. Disc degenerative disease of the thoracic spine IMPRESSION: 1. There is a 3 mm nodule of the superior segment left lower lobe (series 8, image 53) and a 3 mm nodule of the dependent right lower lobe (series 8, image 58). These are nonspecific and favored to represent incidental sequelae of prior infection or inflammation. Metastatic disease is less favored although not excluded. Attention on follow-up. 2. Biliary and pancreatic ductal dilatation as well as gallbladder distension in the partially included upper abdomen, findings in keeping with reported diagnosis of pancreatic cancer. 3.  Aortic Atherosclerosis (ICD10-I70.0). Electronically Signed   By: Eddie Candle M.D.   On: 12/19/2019 16:38   DG C-Arm 1-60 Min-No Report  Result Date: 12/21/2019 Fluoroscopy was utilized by the requesting physician.  No radiographic interpretation.    EKG: Independently reviewed.  Assessment/Plan Active Problems:   Jaundice   Pancreatic mass   Hypothyroidism   GERD (gastroesophageal reflux disease)   HLD (hyperlipidemia)   Pruritus   Elevated LFTs    Pancreatic Mass Jaundice Elevated LFTs Pruritis     - Pt with suspected pancreatic issues in [place]; underwent an ERCP that was unsuccessful     - came to [place], attempted  ERCP unsuccessful, however, EUS confirms mass in head of pancreas     - GI has d/w IR about percutaneous drain; will be placed today     - heme/onc consult after acquisition of sample     - pt/family have discussed possibility of whipple with surgery; but need all appropriate info collected first     - admit to inpt  HLD     - hold home lipitor for right now d/t elevated LFTs  GERD     - protonix  Chronic hypotension?     - on chronic midodrine, continue for now  Hypothyroidism     - continue levothyroxine   DVT prophylaxis: SCDs  Code Status: FULL  Family Communication: With husband at bedside  Disposition Plan: TBD  Consults called: GI, IR  Admission status: Inpatient; ongoing procedures, need for fluids   Jonnie Finner DO [facility]  If 7PM-7AM, please contact night-coverage [email or web address]  12/21/2019, 2:40 PM

## 2019-12-21 NOTE — Op Note (Signed)
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                            Tbili this morning was 13.1 Providers:                Milus Banister, MD, [facility], RN, Cletis [place], Technician Referring MD:             Stark Klein, MD Medicines:                General Anesthesia, Indomethacin 431 mg PR Complications:            No immediate complications. Estimated blood loss:                            None Estimated Blood Loss:     Estimated blood loss: none. Procedure:                Pre-Anesthesia Assessment:                           - Prior to the procedure, a History and Physical                            was performed, and patient medications and                            allergies were reviewed. The patient's tolerance of                            previous anesthesia was also reviewed. The risks                            and benefits of the procedure and the sedation                            options and risks were discussed with the patient.                            All questions were answered, and informed consent  was obtained. Prior Anticoagulants: The patient has                            taken no previous anticoagulant or antiplatelet                            agents. ASA Grade Assessment: II - A patient with                            mild systemic disease. After reviewing the risks                            and benefits, the patient was deemed in                            satisfactory  condition to undergo the procedure.                           After obtaining informed consent, the scope was                            passed under direct vision. Throughout the                            procedure, the patient's blood pressure, pulse, and                            oxygen saturations were monitored continuously. The                            [id number]-Q180V ([id number]) Olympus duodenoscope was                            introduced through the mouth, and used to inject                            contrast into and used to inject contrast into the                            bile duct. The ERCP was accomplished without                            difficulty. The patient tolerated the procedure                            well. Scope In: Scope Out: Findings:      Scout film was normal. The duodenoscope was advanced to the region of a       normal appearing major papilla. I was not able to deeply cannulate the       bile duct despite multiple attempts, two different sphincterotomes and       two different wires. I did accomplish what appeared to be a brief very       distal bile duct cholangiogram. I believe that the distal CBD  stricture       caused by the head of pancreas mass is very tight. Impression:               - I was not able to cannulate the biliary tree                            despite multiple attempts, two different                            sphincterotomes and two different wires. I did                            accomplish what appeared to be a brief very distal                            bile duct cholangiogram. I believe that the distal                            CBD stricture caused by the head of pancreas mass                            is very tight. Moderate Sedation:      Not Applicable - Patient had care per Anesthesia. Recommendation:           - Will admit for IR attempt to decompress the                            biliary tree tomorrow. Will also ask  that medical                            oncology see her while in the hospital.                           - I discussed all this with her son, Dr. Domenick Gong. The longer term plan after PTC will be                            that she return to [place] to establish oncologic                            care (neoadjuvant chemo/XRT given possible [facility]                            involvement) in [place] but likely she will return                            to [facility] for eventual Whipple with Dr. Barry Dienes. Procedure Code(s):        --- Professional ---                           [phone], Endoscopic  retrograde                            cholangiopancreatography (ERCP); diagnostic,                            including collection of specimen(s) by brushing or                            washing, when performed (separate procedure) Diagnosis Code(s):        --- Professional ---                           K83.1, Obstruction of bile duct CPT copyright 2019 American Medical Association. All rights reserved. The codes documented in this report are preliminary and upon coder review may  be revised to meet current compliance requirements. Milus Banister, MD 12/21/2019 11:35:51 AM This report has been signed electronically. Number of Addenda: 0

## 2019-12-21 NOTE — Progress Notes (Signed)
Called [facility] office to reach Attending Shela Commons for patient admit and floor orders. Called Endo and Almyra Free stated that hospitaliist came to Endo looking for patient. I paged Dr.Kyle, listed as [facility] admitter, waiting on return call.

## 2019-12-22 DIAGNOSIS — C250 Malignant neoplasm of head of pancreas: Secondary | ICD-10-CM

## 2019-12-22 DIAGNOSIS — R17 Unspecified jaundice: Secondary | ICD-10-CM

## 2019-12-22 DIAGNOSIS — R7989 Other specified abnormal findings of blood chemistry: Secondary | ICD-10-CM

## 2019-12-22 DIAGNOSIS — R918 Other nonspecific abnormal finding of lung field: Secondary | ICD-10-CM

## 2019-12-22 DIAGNOSIS — K8689 Other specified diseases of pancreas: Secondary | ICD-10-CM

## 2019-12-22 LAB — CBC
HCT: 33.4 % — ABNORMAL LOW (ref 36.0–46.0)
Hemoglobin: 10.8 g/dL — ABNORMAL LOW (ref 12.0–15.0)
MCH: 29.4 pg (ref 26.0–34.0)
MCHC: 32.3 g/dL (ref 30.0–36.0)
MCV: 91 fL (ref 80.0–100.0)
Platelets: 165 10*3/uL (ref 150–400)
RBC: 3.67 MIL/uL — ABNORMAL LOW (ref 3.87–5.11)
RDW: 17.9 % — ABNORMAL HIGH (ref 11.5–15.5)
WBC: 13.6 10*3/uL — ABNORMAL HIGH (ref 4.0–10.5)
nRBC: 0 % (ref 0.0–0.2)

## 2019-12-22 LAB — COMPREHENSIVE METABOLIC PANEL
ALT: 760 U/L — ABNORMAL HIGH (ref 0–44)
AST: 396 U/L — ABNORMAL HIGH (ref 15–41)
Albumin: 2.7 g/dL — ABNORMAL LOW (ref 3.5–5.0)
Alkaline Phosphatase: 560 U/L — ABNORMAL HIGH (ref 38–126)
Anion gap: 10 (ref 5–15)
BUN: 15 mg/dL (ref 8–23)
CO2: 24 mmol/L (ref 22–32)
Calcium: 8.1 mg/dL — ABNORMAL LOW (ref 8.9–10.3)
Chloride: 106 mmol/L (ref 98–111)
Creatinine, Ser: 0.84 mg/dL (ref 0.44–1.00)
GFR calc Af Amer: >60 mL/min (ref >60)
GFR calc non Af Amer: >60 mL/min (ref >60)
Glucose, Bld: 112 mg/dL — ABNORMAL HIGH (ref 70–99)
Potassium: 3.5 mmol/L (ref 3.5–5.1)
Sodium: 140 mmol/L (ref 135–145)
Total Bilirubin: 8.4 mg/dL — ABNORMAL HIGH (ref 0.3–1.2)
Total Protein: 5.6 g/dL — ABNORMAL LOW (ref 6.5–8.1)

## 2019-12-22 LAB — TSH: TSH: 0.533 u[IU]/mL (ref 0.350–4.500)

## 2019-12-22 MED ORDER — PIPERACILLIN-TAZOBACTAM 3.375 G IVPB
3.3750 g | Freq: Three times a day (TID) | INTRAVENOUS | Status: DC
  Administered 2019-12-22 – 2019-12-23 (×3): 3.375 g via INTRAVENOUS
  Filled 2019-12-22 (×3): qty 50

## 2019-12-22 NOTE — Plan of Care (Signed)
Continue current POC 

## 2019-12-22 NOTE — Progress Notes (Addendum)
[facility] Progress Note  CC: obstructive jaundice, pancreatic mass  Subjective: She vomited a small amount after eating an English muffin for breakfast this morning.  She is having mild right upper quadrant abdominal discomfort.   Objective:  Vital signs in last 24 hours: Temp:  [97.8 F (36.6 C)-98.7 F (37.1 C)] 98.5 F (36.9 C) (01/01 0652) Pulse Rate:  [56-73] 73 (01/01 0652) Resp:  [10-19] 16 (01/01 0652) BP: (106-158)/(49-96) 116/56 (01/01 0652) SpO2:  [94 %-100 %] 94 % (01/01 0652) Last BM Date: 12/21/19 General: Alert fatigue appearing 79 year old female in no acute distress Eyes: scleral icterus Heart: Regular rate and rhythm, no murmurs Pulm: Lungs clear throughout Abdomen: Mild right upper quadrant tenderness without rebound or guarding, percutaneous drain and dressing intact, positive bowel sounds to all 4 quadrants, no HSM. Extremities:  Without edema. Neurologic:  Alert and  oriented x4;  grossly normal neurologically. Psych:  Alert and cooperative. Normal mood and affect.  Intake/Output from previous day: 12/31 0701 - 01/01 0700 In: 1109.9 [I.V.:1059.9; IV Piggyback:50] Out: 625 [Urine:200; Drains:425] Intake/Output this shift: Total I/O In: 200 [P.O.:120; I.V.:80] Out: 340 [Urine:200; Drains:140]  Lab Results: Recent Labs    12/22/19 0403  WBC 13.6*  HGB 10.8*  HCT 33.4*  PLT 165   BMET Recent Labs    12/21/19 0855 12/22/19 0403  NA 139 140  K 3.5 3.5  CL 107 106  CO2 21* 24  GLUCOSE 97 112*  BUN 13 15  CREATININE 0.52 0.84  CALCIUM 8.1* 8.1*   LFT Recent Labs    12/22/19 0403  PROT 5.6*  ALBUMIN 2.7*  AST 396*  ALT 760*  ALKPHOS 560*  BILITOT 8.4*   PT/INR Recent Labs    12/21/19 1408  LABPROT 13.2  INR 1.0   Hepatitis Panel No results for input(s): HEPBSAG, HCVAB, HEPAIGM, HEPBIGM in the last 72 hours.  DG C-Arm 1-60 Min-No Report  Result Date: 12/21/2019 Fluoroscopy was utilized by the  requesting physician.  No radiographic interpretation.   IR BILIARY DRAIN PLACEMENT WITH CHOLANGIOGRAM  Result Date: 12/21/2019 INDICATION: 79 year old with newly diagnosed pancreatic cancer and obstructive jaundice. Prior ERCP procedures were unable to decompress the biliary system. Plan for percutaneous transhepatic cholangiogram and placement of biliary drain. EXAM: PERCUTANEOUS TRANSHEPATIC CHOLANGIOGRAM WITH ULTRASOUND AND FLUOROSCOPIC GUIDANCE PLACEMENT OF INTERNAL/EXTERNAL BILIARY DRAIN MEDICATIONS: Zosyn 3.375 g; The antibiotic was administered within an appropriate time frame prior to the initiation of the procedure. ANESTHESIA/SEDATION: Moderate (conscious) sedation was employed during this procedure. A total of Versed 4.0 mg and Fentanyl 100 mcg was administered intravenously. Moderate Sedation Time: 47 minutes minutes. The patient's level of consciousness and vital signs were monitored continuously by radiology nursing throughout the procedure under my direct supervision. FLUOROSCOPY TIME:  Fluoroscopy Time: 10 minutes 12 seconds (125 mGy). COMPLICATIONS: None immediate. PROCEDURE: Informed written consent was obtained from the patient after a thorough discussion of the procedural risks, benefits and alternatives. All questions were addressed. Maximal Sterile Barrier Technique was utilized including caps, mask, sterile gowns, sterile gloves, sterile drape, hand hygiene and skin antiseptic. A timeout was performed prior to the initiation of the procedure. The anterior and right side of the abdomen was prepped and draped in a sterile fashion. Liver was evaluated with ultrasound. Skin was anesthetized with 1% lidocaine. Both left and right bile ducts were targeted with ultrasound guidance. The intrahepatic bile ducts were small and unable to successfully cannulate a bile duct in the left hepatic lobe. Using  ultrasound guidance, 22 gauge needle was successfully advanced into the right hepatic lobe and  avoid the distended gallbladder. Needle was advanced centrally and the needle was pulled back while injecting contrast. A peripheral duct was opacified with contrast and successfully cannulated using a 0.018 wire. Transitional dilator inner sheath was advanced over the wire and injected with contrast to confirm placement in the biliary system. Transitional dilator was then advanced into the common bile duct. Bentson wire was placed and a Kumpe catheter was advanced into the distal common bile duct. The biliary obstruction was successfully traversed using a Roadrunner wire. Catheter was advanced into the duodenum. Stiff Amplatz wire was placed. The tract was dilated to accommodate a 10 [place] biliary drain. Large amount of dark bile was aspirated from the biliary system. Final cholangiogram was performed through the catheter. Catheter was attached to a gravity bag. Catheter was sutured to skin. Bandage was placed at the drain site. FINDINGS: Ultrasound demonstrated a markedly distended gallbladder with stones. There was mild intrahepatic biliary dilatation based on ultrasound. Mildly dilated right intrahepatic bile duct was successfully cannulated. High-grade obstruction in the distal common bile duct was identified. Catheter and wire were able to traverse the obstruction. 7 [place] biliary drain extends across the obstruction with the tip in the duodenum. IMPRESSION: 1. Successful placement of an internal / external biliary drain. 2. High-grade obstruction in the distal common bile duct related to the pancreatic mass. Electronically Signed   By: Markus Daft M.D.   On: 12/21/2019 17:50    Assessment / Plan:  26. 79 year old female with painless obstructive jaundice diagnosed with a 1.9cm mass to the pancreatic head with dilatation of the CBD and PD s/p unsuccessful ERCP by Dr. Nelva Bush 12/21 in [place]. A cholangiogram identified a distal CBD stricture. She traveled to [place] to stay with her son. She underwent an EUS  and ERCP by Dr. Ardis Hughs 12/31. The EUS confirmed a 1.8 cm mass to the head of the pancreas causing main pancreatic duct and biliary duct dilatation.  The mass directly abuts the SMV for 5 to 9 mm suggesting possible invasion.  Preliminary cytology was positive for adenocarcinoma.  An ERCP was attempted, Dr. Ardis Hughs was unable to cannulate the biliary tree despite multiple attempts to sphincterotomies, a brief distal bile duct cholangiogram stricture was assessed to be very tight.  An internal/external biliary drain placement biliary drain was placed by IR.  LFTs slightly increased today. Alk phosphatase 560 >> 539.  AST 396 >> 269.  ALT 760 > 578.  Total bili 8.4 >> 13.1. WBC 13.6.  She received Zosyn 3.375 mg x 1 dose on 12/31.  She is afebrile.  -Oncology consult -IV fluids per the hospitalist -Clear liquid diet -Continue Pantoprazole 40 mg IV every 24 hours -Repeat CBC and hepatic panel in a.m. -Pain management per the hospitalist -Promethazine 12.5 mg 1 tab every 6 hours as needed as previously ordered, if not tolerated she can have Zofran 4 mg 1 p.o. every 6 hours as needed. -Plans for repeat ERCP with drain removal to be determined after oncology consult completed -?  Does she need continued Zosyn  2. GERD, stable   3. 42m LLL pulmonary nodule and a 362mRLL nodule, nonspecific, inflammation,infection vs metastatic disease per chest CT 12/29.   Further recommendations per Dr. NaSilverio Decamp   Principal Problem:   Pancreatic mass Active Problems:   Jaundice   Hypothyroidism   GERD (gastroesophageal reflux disease)   HLD (hyperlipidemia)  Pruritus   Elevated LFTs     LOS: 1 day   Summer Knight  12/22/2019, 10:54 AM   Attending physician's note   I have taken an interval history, reviewed the chart and examined the patient. I agree with the Advanced Practitioner's note, impression and recommendations.   79 year old female with obstructive jaundice secondary to pancreatic  head lesion with sonographic evidence of superior mesenteric vein invasion, dilated pancreatic duct and distal CBD stricture, failed ERCP s/p PTC drain No evidence of distant metastatic lesions LFT trending down after biliary drain placement Remains hemodynamically stable, has mild leukocytosis. Please consult oncology Advance diet as tolerated Continue antibiotics for 7-day course, can transition to oral Augmentin on discharge Okay to discharge home later this evening or tomorrow   K. Denzil Magnuson , MD [phone]

## 2019-12-22 NOTE — Consult Note (Signed)
[facility] CONSULT NOTE  Patient Care Team: Prince Solian, MD as PCP - General (Internal Medicine)  HEME/ONC OVERVIEW: 1. 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  [place], for further evaluation.  Repeat ERCP was also unsuccessful, but EUS identified a pancreatic head mass with possible SMV invasion.  The mass was biopsied, but the pathology is still pending.  CT chest did not show any definite metastatic disease.  IR placed a biliary drain with improvement in total bilirubin.  Patient had already met with Dr. BBarry Dienesof surgical oncology.  Oncology was consulted for further evaluation.  -I reviewed the imaging results in detail with the patient, as well as the NCCN guideline -To complete the staging studies, the patient will need PET scan to rule out occult metastases (which can only be done in the outpatient setting) -In addition, I would recommend checking baseline CA 19-9  -The patient had met with Dr. BBarry Dienesof surgical oncology, who recommended starting neoadjuvant chemotherapy due to the size of the tumor and possible SMV invasion -I discussed the  some of the potential treatment options for neoadjuvant therapy, including FOLFIRINOX and gemcitabine/Abraxane.  As her Tbili remains very elevated, her Tbili would need to improve significantly (ideally close to normal or normal) before initiating systemic therapy.   -[place]  -We also discussed placing port prior to discharge, but the patient's family would like to wait until she is established with oncology [place]  Thank you for the opportunity to participate in the patient's care. Please do not hesitate to contact me if there are any questions.   YTish Men MD 12/22/2019 12:21 PM  CHIEF COMPLAINTS/PURPOSE OF CONSULTATION:  Suspected pancreatic cancer  HISTORY OF PRESENTING ILLNESS:  Ms. TZamoranois a 79yo F w/ hx of hypothyroidism and GERD, for whom oncology was consulted for suspected pancreatic adenocarcinoma.  [place] for painless jaundice and pruritis, and work-up showed Tbili ~6, lipase in the 800's, and a mass in the pancreatic head.  [place] and [place], for further evaluation.  Repeat ERCP was also unsuccessful, but EUS identified a pancreatic head mass with possible SMV invasion.  CT chest did not show any definite metastatic disease.  IR placed a biliary drain with improvement in total bilirubin.  Patient had already met with Dr. BBarry Dienesof surgical oncology.  Oncology was consulted for further evaluation.   Patient reports that she has mild soreness near the biliary drain site, but otherwise denies any constitutional symptoms, chest pain, dyspnea,  abdominal pain, nausea, vomiting, diarrhea, or abnormal bleeding/bruising.   I have reviewed her chart and materials related to her cancer extensively and collaborated history  with the patient. Summary of oncologic history is as follows: Oncology History   No history exists.    MEDICAL HISTORY:  Past Medical History:  Diagnosis Date  . GERD (gastroesophageal reflux disease)   . Hypothyroidism     SURGICAL HISTORY: Past Surgical History:  Procedure Laterality Date  . IR BILIARY DRAIN PLACEMENT WITH CHOLANGIOGRAM  12/21/2019  . JOINT REPLACEMENT     Right Knee  . Lipoma removed from left arm    . right breast biopsy  1980's    SOCIAL HISTORY: Social History   Socioeconomic History  . Marital status: Married    Spouse name: Not on file  . Number of children: Not on file  . Years of education: Not on file  . Highest education level: Not on file  Occupational History  . Not on file  Tobacco Use  . Smoking status: Never Smoker  . Smokeless tobacco: Never Used  Substance and Sexual Activity  . Alcohol use: Never  . Drug use: Never  . Sexual activity: Not Currently  Other Topics Concern  . Not on file  Social History Narrative  . Not on file   Social Determinants of Health   Financial Resource Strain:   . Difficulty of Paying Living Expenses: Not on file  Food Insecurity:   . Worried About Charity fundraiser in the Last Year: Not on file  . Ran Out of Food in the Last Year: Not on file  Transportation Needs:   . Lack of Transportation (Medical): Not on file  . Lack of Transportation (Non-Medical): Not on file  Physical Activity:   . Days of Exercise per Week: Not on file  . Minutes of Exercise per Session: Not on file  Stress:   . Feeling of Stress : Not on file  Social Connections:   . Frequency of Communication with Friends and Family: Not on file  . Frequency of Social Gatherings with Friends and Family: Not on file  . Attends Religious Services: Not on file  . Active Member of Clubs or Organizations: Not on file  . Attends Archivist Meetings: Not on file  . Marital Status: Not on file  Intimate Partner Violence:    . Fear of Current or Ex-Partner: Not on file  . Emotionally Abused: Not on file  . Physically Abused: Not on file  . Sexually Abused: Not on file    FAMILY HISTORY: History reviewed. No pertinent family history.  ALLERGIES:  has No Known Allergies.  MEDICATIONS:  Current Facility-Administered Medications  Medication Dose Route Frequency Provider Last Rate Last Admin  . docusate sodium (COLACE) capsule 100 mg  100 mg Oral BID Marylyn Ishihara, Tyrone A, DO   100 mg at 12/22/19 0940  . ibuprofen (ADVIL) tablet 400 mg  400 mg Oral Q6H PRN Marylyn Ishihara, Tyrone A, DO      . lactated ringers infusion   Intravenous Continuous Milus Banister, MD   Stopped at 12/21/19 1118  . levothyroxine (SYNTHROID) tablet 88 mcg  88 mcg Oral Q0600 Cherylann Ratel A, DO   88 mcg at 12/22/19 0645  . midodrine (PROAMATINE) tablet 5 mg  5 mg Oral TID WC Kyle, Tyrone A, DO   5 mg at 12/22/19 0940  . oxyCODONE (Oxy IR/ROXICODONE) immediate release tablet 5 mg  5 mg Oral Q4H PRN Henn,  Adam, MD   5 mg at 12/21/19 1734  . pantoprazole (PROTONIX) injection 40 mg  40 mg Intravenous Q24H Kyle, Tyrone A, DO   40 mg at 12/21/19 1734  . polyvinyl alcohol (LIQUIFILM TEARS) 1.4 % ophthalmic solution 1 drop  1 drop Both Eyes TID PRN Marylyn Ishihara, Tyrone A, DO      . promethazine (PHENERGAN) tablet 12.5 mg  12.5 mg Oral Q6H PRN Marylyn Ishihara, Tyrone A, DO      . raloxifene (EVISTA) tablet 60 mg  60 mg Oral QPM Kyle, Tyrone A, DO   60 mg at 12/21/19 1751    REVIEW OF SYSTEMS:   Constitutional: ( - ) fevers, ( - )  chills , ( - ) night sweats Eyes: ( - ) blurriness of vision, ( - ) double vision, ( - ) watery eyes Ears, nose, mouth, throat, and face: ( - ) mucositis, ( - ) sore throat Respiratory: ( - ) cough, ( - ) dyspnea, ( - ) wheezes Cardiovascular: ( - ) palpitation, ( - ) chest discomfort, ( - ) lower extremity swelling Gastrointestinal:  ( - ) nausea, ( - ) heartburn, ( - ) change in bowel habits Skin: ( - ) abnormal skin rashes Lymphatics: ( - ) new  lymphadenopathy, ( - ) easy bruising Neurological: ( - ) numbness, ( - ) tingling, ( - ) new weaknesses Behavioral/Psych: ( - ) mood change, ( - ) new changes  All other systems were reviewed with the patient and are negative.  PHYSICAL EXAMINATION: ECOG PERFORMANCE STATUS: 1 - Symptomatic but completely ambulatory  Vitals:   12/22/19 0652 12/22/19 1107  BP: (!) 116/56 (!) 119/54  Pulse: 73 74  Resp: 16 16  Temp: 98.5 F (36.9 C) 99.1 F (37.3 C)  SpO2: 94% 95%   Filed Weights   12/21/19 0820  Weight: 172 lb (78 kg)    GENERAL: alert, no distress and comfortable SKIN: skin color, texture, turgor are normal, no rashes or significant lesions EYES: conjunctiva are pink and non-injected, sclera clear OROPHARYNX: no exudate, no erythema; lips, buccal mucosa, and tongue normal  NECK: supple, non-tender LUNGS: clear to auscultation with normal breathing effort HEART: regular rate & rhythm, no murmurs, no lower extremity edema ABDOMEN: soft, non-tender, non-distended, normal bowel sounds Musculoskeletal: no cyanosis of digits and no clubbing  PSYCH: alert & oriented x 3, fluent speech  LABORATORY DATA:  I have reviewed the data as listed Lab Results  Component Value Date   WBC 13.6 (H) 12/22/2019   HGB 10.8 (L) 12/22/2019   HCT 33.4 (L) 12/22/2019   MCV 91.0 12/22/2019   PLT 165 12/22/2019   Lab Results  Component Value Date   NA 140 12/22/2019   K 3.5 12/22/2019   CL 106 12/22/2019   CO2 24 12/22/2019    RADIOGRAPHIC STUDIES: I have personally reviewed the radiological images as listed and agreed with the findings in the report. CT CHEST W CONTRAST  Result Date: 12/19/2019 CLINICAL DATA:  Recent diagnosis pancreatic cancer, evaluate for metastatic disease EXAM: CT CHEST WITH CONTRAST TECHNIQUE: Multidetector CT imaging of the chest was performed during intravenous contrast administration. CONTRAST:  33m ISOVUE-300 IOPAMIDOL (ISOVUE-300) INJECTION 61% COMPARISON:   None. FINDINGS: Cardiovascular: Minimal aortic atherosclerosis. Normal heart size. No pericardial effusion. Mediastinum/Nodes: No enlarged mediastinal, hilar, or axillary lymph nodes. Small hiatal hernia. Thyroid gland, trachea, and esophagus demonstrate no significant findings. Lungs/Pleura: Dependent bibasilar scarring and/or atelectasis. 3 mm nodule of the superior segment left  lower lobe (series 8, image 53). 3 mm nodule of the dependent right lower lobe (series 8, image 58). No pleural effusion or pneumothorax. Upper Abdomen: No acute abnormality. Biliary and pancreatic ductal dilatation as well as gallbladder distension in the partially included upper abdomen. Cholelithiasis. Small 7 mm hyperenhancing lesion of the liver dome (series 2, image 87). Musculoskeletal: No chest wall mass or suspicious bone lesions identified. Disc degenerative disease of the thoracic spine IMPRESSION: 1. There is a 3 mm nodule of the superior segment left lower lobe (series 8, image 53) and a 3 mm nodule of the dependent right lower lobe (series 8, image 58). These are nonspecific and favored to represent incidental sequelae of prior infection or inflammation. Metastatic disease is less favored although not excluded. Attention on follow-up. 2. Biliary and pancreatic ductal dilatation as well as gallbladder distension in the partially included upper abdomen, findings in keeping with reported diagnosis of pancreatic cancer. 3. Aortic Atherosclerosis (ICD10-I70.0). Electronically Signed   By: Eddie Candle M.D.   On: 12/19/2019 16:38   DG C-Arm 1-60 Min-No Report  Result Date: 12/21/2019 Fluoroscopy was utilized by the requesting physician.  No radiographic interpretation.   IR BILIARY DRAIN PLACEMENT WITH CHOLANGIOGRAM  Result Date: 12/21/2019 INDICATION: 79 year old with newly diagnosed pancreatic cancer and obstructive jaundice. Prior ERCP procedures were unable to decompress the biliary system. Plan for percutaneous  transhepatic cholangiogram and placement of biliary drain. EXAM: PERCUTANEOUS TRANSHEPATIC CHOLANGIOGRAM WITH ULTRASOUND AND FLUOROSCOPIC GUIDANCE PLACEMENT OF INTERNAL/EXTERNAL BILIARY DRAIN MEDICATIONS: Zosyn 3.375 g; The antibiotic was administered within an appropriate time frame prior to the initiation of the procedure. ANESTHESIA/SEDATION: Moderate (conscious) sedation was employed during this procedure. A total of Versed 4.0 mg and Fentanyl 100 mcg was administered intravenously. Moderate Sedation Time: 47 minutes minutes. The patient's level of consciousness and vital signs were monitored continuously by radiology nursing throughout the procedure under my direct supervision. FLUOROSCOPY TIME:  Fluoroscopy Time: 10 minutes 12 seconds (125 mGy). COMPLICATIONS: None immediate. PROCEDURE: Informed written consent was obtained from the patient after a thorough discussion of the procedural risks, benefits and alternatives. All questions were addressed. Maximal Sterile Barrier Technique was utilized including caps, mask, sterile gowns, sterile gloves, sterile drape, hand hygiene and skin antiseptic. A timeout was performed prior to the initiation of the procedure. The anterior and right side of the abdomen was prepped and draped in a sterile fashion. Liver was evaluated with ultrasound. Skin was anesthetized with 1% lidocaine. Both left and right bile ducts were targeted with ultrasound guidance. The intrahepatic bile ducts were small and unable to successfully cannulate a bile duct in the left hepatic lobe. Using ultrasound guidance, 22 gauge needle was successfully advanced into the right hepatic lobe and avoid the distended gallbladder. Needle was advanced centrally and the needle was pulled back while injecting contrast. A peripheral duct was opacified with contrast and successfully cannulated using a 0.018 wire. Transitional dilator inner sheath was advanced over the wire and injected with contrast to confirm  placement in the biliary system. Transitional dilator was then advanced into the common bile duct. Bentson wire was placed and a Kumpe catheter was advanced into the distal common bile duct. The biliary obstruction was successfully traversed using a Roadrunner wire. Catheter was advanced into the duodenum. Stiff Amplatz wire was placed. The tract was dilated to accommodate a 10 [place] biliary drain. Large amount of dark bile was aspirated from the biliary system. Final cholangiogram was performed through the catheter. Catheter was attached to  a gravity bag. Catheter was sutured to skin. Bandage was placed at the drain site. FINDINGS: Ultrasound demonstrated a markedly distended gallbladder with stones. There was mild intrahepatic biliary dilatation based on ultrasound. Mildly dilated right intrahepatic bile duct was successfully cannulated. High-grade obstruction in the distal common bile duct was identified. Catheter and wire were able to traverse the obstruction. 41 [place] biliary drain extends across the obstruction with the tip in the duodenum. IMPRESSION: 1. Successful placement of an internal / external biliary drain. 2. High-grade obstruction in the distal common bile duct related to the pancreatic mass. Electronically Signed   By: Markus Daft M.D.   On: 12/21/2019 17:50    PATHOLOGY: I have reviewed the pathology reports as documented in the oncologist history.

## 2019-12-22 NOTE — Progress Notes (Signed)
PROGRESS NOTE    Summer Knight  [id number] DOB: 08/02/1941 DOA: 12/21/2019 PCP: Prince Solian, MD    Brief Narrative:  79 y.o. female with medical history significant of GERD, hypothyroidism. Presents with several weeks of pruritis and juandice. She was at home in [facility] and became concerned about her condition. She was see by her PCP team and they recommended that she get some tests run. They found that she had hyperbilirubinemia and recommended that she ERCP performed. That procedure was not successful. So, her family recommended that she come to [facility] to have the procedure run here. She was seen by GI. ERCP was again unsuccessful; however, an EUS was positive for a pancreatic mass. GI discussed the situation with IR, who will place a drain today. TRH was called for admission. She denies any aggravating or alleviating factors. 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 Oncology has recommended formal surgical consult to determine whether pancreatic mass can be surgically resected or not  HLD - Currently holding home lipitor for right now d/t elevated LFTs -continue to follow LFT's  GERD -Continue on protonix as tolerated  Chronic hypotension - on chronic midodrine, continue for now -BP currently stable  Hypothyroidism - continue levothyroxine as  tolerated  DVT prophylaxis: SCD's Code Status: Full Family Communication: Pt in room, family not at bedside Disposition Plan: Uncertain at this time  Consultants:   GI  IR  Oncology  Procedures:   Biliary drain placement 12/31  Antimicrobials: Anti-infectives (From admission, onward)   Start     Dose/Rate Route Frequency Ordered Stop   12/21/19 1545  piperacillin-tazobactam (ZOSYN) IVPB 3.375 g     3.375 g 100 mL/hr over 30 Minutes Intravenous  Once 12/21/19 1542 12/21/19 1620       Subjective: Denies abd pain or nausea  Objective: Vitals:   12/21/19 2154 12/22/19 0135 12/22/19 0652 12/22/19 1107  BP: (!) 142/66 (!) 106/49 (!) 116/56 (!) 119/54  Pulse: 65 71 73 74  Resp: 16 18 16 16   Temp: 98.5 F (36.9 C) 98.7 F (37.1 C) 98.5 F (36.9 C) 99.1 F (37.3 C)  TempSrc: Oral Oral Oral Oral  SpO2: 94% 95% 94% 95%  Weight:      Height:        Intake/Output Summary (Last 24 hours) at 12/22/2019 1241 Last data filed at 12/22/2019 1000 Gross per 24 hour  Intake 509.86 ml  Output 965 ml  Net -455.14 ml   Filed Weights   12/21/19 0820  Weight: 78 kg    Examination:  General exam: Appears calm and comfortable  Respiratory system: Clear to auscultation. Respiratory effort normal. Cardiovascular system: S1 & S2 heard, Regular Gastrointestinal system: Abdomen is nondistended, soft and nontender. No organomegaly or masses felt. Normal bowel sounds heard. Central nervous system: Alert and oriented. No focal neurological deficits. Extremities: Symmetric 5 x 5 power. Skin: No rashes, lesions, appears  jaundiced Psychiatry: Judgement and insight appear normal. Mood & affect appropriate.   Data Reviewed: I have personally reviewed following labs and imaging studies  CBC: Recent Labs  Lab 12/22/19 0403  WBC 13.6*  HGB 10.8*  HCT 33.4*  MCV 91.0  PLT [id number]   Basic Metabolic Panel: Recent Labs  Lab 12/21/19 0855 12/22/19 0403  NA 139 140  K 3.5 3.5  CL 107  106  CO2 21* 24  GLUCOSE 97 112*  BUN 13 15  CREATININE 0.52 0.84  CALCIUM 8.1* 8.1*   GFR: Estimated Creatinine Clearance: 50.1 mL/min (by C-G formula based on SCr of 0.84 mg/dL). Liver Function Tests: Recent Labs  Lab 12/21/19 0855 12/22/19 0403  AST 269* 396*  ALT 578* 760*  ALKPHOS 539* 560*  BILITOT 13.1* 8.4*  PROT 5.1* 5.6*  ALBUMIN 2.5* 2.7*   No results for input(s): LIPASE, AMYLASE in the last 168 hours. No results for input(s): AMMONIA in the last 168 hours. Coagulation Profile: Recent Labs  Lab 12/21/19 1408  INR 1.0   Cardiac Enzymes: No results for input(s): CKTOTAL, CKMB, CKMBINDEX, TROPONINI in the last 168 hours. BNP (last 3 results) No results for input(s): PROBNP in the last 8760 hours. HbA1C: No results for input(s): HGBA1C in the last 72 hours. CBG: No results for input(s): GLUCAP in the last 168 hours. Lipid Profile: No results for input(s): CHOL, HDL, LDLCALC, TRIG, CHOLHDL, LDLDIRECT in the last 72 hours. Thyroid Function Tests: Recent Labs    12/22/19 0403  TSH 0.533   Anemia Panel: No results for input(s): VITAMINB12, FOLATE, FERRITIN, TIBC, IRON, RETICCTPCT in the last 72 hours. Sepsis Labs: No results for input(s): PROCALCITON, LATICACIDVEN in the last 168 hours.  Recent Results (from the past 240 hour(s))  SARS CORONAVIRUS 2 (TAT 6-24 HRS) Nasopharyngeal Nasopharyngeal Swab     Status: None   Collection Time: 12/19/19 10:59 AM   Specimen: Nasopharyngeal Swab  Result Value Ref Range Status   SARS Coronavirus 2 NEGATIVE NEGATIVE Final    Comment: (NOTE) SARS-CoV-2 target nucleic acids are NOT DETECTED. The SARS-CoV-2 RNA is generally detectable in upper and lower respiratory specimens during the acute phase of infection. Negative results do not preclude SARS-CoV-2 infection, do not rule out co-infections with other pathogens, and should not be used as the sole basis for treatment or other patient management decisions. Negative  results must be combined with clinical observations, patient history, and epidemiological information. The expected result is Negative. Fact Sheet for Patients: SugarRoll.be Fact Sheet for Healthcare Providers: https://www.woods-mathews.com/ This test is not yet approved or cleared by the [place] FDA and  has been authorized for detection and/or diagnosis of SARS-CoV-2 by FDA under an Emergency Use Authorization (EUA). This EUA will remain  in effect (meaning this test can be used) for the duration of the COVID-19 declaration under Section 56 4(b)(1) of the Act, 21 U.S.C. section 360bbb-3(b)(1), unless the authorization is terminated or revoked sooner. Performed at [facility], [place] [address], [place],  [address]      Radiology Studies: DG C-Arm 1-60 Min-No Report  Result Date: 12/21/2019 Fluoroscopy was utilized by the requesting physician.  No radiographic interpretation.   IR BILIARY DRAIN PLACEMENT WITH CHOLANGIOGRAM  Result Date: 12/21/2019 INDICATION: 79 year old with newly diagnosed pancreatic cancer and obstructive jaundice. Prior ERCP procedures were unable to decompress the biliary system. Plan for percutaneous transhepatic cholangiogram and placement of biliary drain. EXAM: PERCUTANEOUS TRANSHEPATIC CHOLANGIOGRAM WITH ULTRASOUND AND FLUOROSCOPIC GUIDANCE PLACEMENT OF INTERNAL/EXTERNAL  BILIARY DRAIN MEDICATIONS: Zosyn 3.375 g; The antibiotic was administered within an appropriate time frame prior to the initiation of the procedure. ANESTHESIA/SEDATION: Moderate (conscious) sedation was employed during this procedure. A total of Versed 4.0 mg and Fentanyl 100 mcg was administered intravenously. Moderate Sedation Time: 47 minutes minutes. The patient's level of consciousness and vital signs were monitored continuously by radiology nursing throughout the procedure under my direct supervision. FLUOROSCOPY TIME:  Fluoroscopy  Time: 10 minutes 12 seconds (125 mGy). COMPLICATIONS: None immediate. PROCEDURE: Informed written consent was obtained from the patient after a thorough discussion of the procedural risks, benefits and alternatives. All questions were addressed. Maximal Sterile Barrier Technique was utilized including caps, mask, sterile gowns, sterile gloves, sterile drape, hand hygiene and skin antiseptic. A timeout was performed prior to the initiation of the procedure. The anterior and right side of the abdomen was prepped and draped in a sterile fashion. Liver was evaluated with ultrasound. Skin was anesthetized with 1% lidocaine. Both left and right bile ducts were targeted with ultrasound guidance. The intrahepatic bile ducts were small and unable to successfully cannulate a bile duct in the left hepatic lobe. Using ultrasound guidance, 22 gauge needle was successfully advanced into the right hepatic lobe and avoid the distended gallbladder. Needle was advanced centrally and the needle was pulled back while injecting contrast. A peripheral duct was opacified with contrast and successfully cannulated using a 0.018 wire. Transitional dilator inner sheath was advanced over the wire and injected with contrast to confirm placement in the biliary system. Transitional dilator was then advanced into the common bile duct. Bentson wire was placed and a Kumpe catheter was advanced into the distal common bile duct. The biliary obstruction was successfully traversed using a Roadrunner wire. Catheter was advanced into the duodenum. Stiff Amplatz wire was placed. The tract was dilated to accommodate a 10 [place] biliary drain. Large amount of dark bile was aspirated from the biliary system. Final cholangiogram was performed through the catheter. Catheter was attached to a gravity bag. Catheter was sutured to skin. Bandage was placed at the drain site. FINDINGS: Ultrasound demonstrated a markedly distended gallbladder with stones. There was  mild intrahepatic biliary dilatation based on ultrasound. Mildly dilated right intrahepatic bile duct was successfully cannulated. High-grade obstruction in the distal common bile duct was identified. Catheter and wire were able to traverse the obstruction. 20 [place] biliary drain extends across the obstruction with the tip in the duodenum. IMPRESSION: 1. Successful placement of an internal / external biliary drain. 2. High-grade obstruction in the distal common bile duct related to the pancreatic mass. Electronically Signed   By: Markus Daft M.D.   On: 12/21/2019 17:50    Scheduled Meds: . docusate sodium  100 mg Oral BID  . levothyroxine  88 mcg Oral Q0600  . midodrine  5 mg Oral TID WC  . pantoprazole (PROTONIX) IV  40 mg Intravenous Q24H  . raloxifene  60 mg Oral QPM   Continuous Infusions: . lactated ringers Stopped (12/21/19 1118)     LOS: 1 day   Summer Lund, MD [facility] Pager On Amion  If 7PM-7AM, please contact night-coverage 12/22/2019, 12:41 PM

## 2019-12-22 NOTE — Progress Notes (Signed)
Reviewed drain care instructions with patient and spouse. Demonstrated dressing change, s/s infection, site care, drain flush, bag empty/care, fluid measurement. Patient and spouse verbalized understanding. Both able to teach back at this time. Will reinforce during care.

## 2019-12-23 ENCOUNTER — Encounter: Payer: Self-pay | Admitting: *Deleted

## 2019-12-23 DIAGNOSIS — C251 Malignant neoplasm of body of pancreas: Secondary | ICD-10-CM

## 2019-12-23 LAB — COMPREHENSIVE METABOLIC PANEL
ALT: 814 U/L — ABNORMAL HIGH (ref 0–44)
AST: 399 U/L — ABNORMAL HIGH (ref 15–41)
Albumin: 2.6 g/dL — ABNORMAL LOW (ref 3.5–5.0)
Alkaline Phosphatase: 486 U/L — ABNORMAL HIGH (ref 38–126)
Anion gap: 9 (ref 5–15)
BUN: 12 mg/dL (ref 8–23)
CO2: 25 mmol/L (ref 22–32)
Calcium: 8 mg/dL — ABNORMAL LOW (ref 8.9–10.3)
Chloride: 106 mmol/L (ref 98–111)
Creatinine, Ser: 0.85 mg/dL (ref 0.44–1.00)
GFR calc Af Amer: >60 mL/min (ref >60)
GFR calc non Af Amer: >60 mL/min (ref >60)
Glucose, Bld: 95 mg/dL (ref 70–99)
Potassium: 3.3 mmol/L — ABNORMAL LOW (ref 3.5–5.1)
Sodium: 140 mmol/L (ref 135–145)
Total Bilirubin: 6.7 mg/dL — ABNORMAL HIGH (ref 0.3–1.2)
Total Protein: 5.6 g/dL — ABNORMAL LOW (ref 6.5–8.1)

## 2019-12-23 LAB — CBC
HCT: 33.1 % — ABNORMAL LOW (ref 36.0–46.0)
Hemoglobin: 10.5 g/dL — ABNORMAL LOW (ref 12.0–15.0)
MCH: 29.3 pg (ref 26.0–34.0)
MCHC: 31.7 g/dL (ref 30.0–36.0)
MCV: 92.5 fL (ref 80.0–100.0)
Platelets: 167 10*3/uL (ref 150–400)
RBC: 3.58 MIL/uL — ABNORMAL LOW (ref 3.87–5.11)
RDW: 17.4 % — ABNORMAL HIGH (ref 11.5–15.5)
WBC: 12.1 10*3/uL — ABNORMAL HIGH (ref 4.0–10.5)
nRBC: 0 % (ref 0.0–0.2)

## 2019-12-23 MED ORDER — AMOXICILLIN-POT CLAVULANATE 875-125 MG PO TABS: 1.0000 | ORAL_TABLET | Freq: Two times a day (BID) | ORAL | 0 refills | Status: AC

## 2019-12-23 MED ORDER — POTASSIUM CHLORIDE CRYS ER 20 MEQ PO TBCR
40.0000 meq | EXTENDED_RELEASE_TABLET | Freq: Once | ORAL | Status: AC
  Administered 2019-12-23: 40 meq via ORAL
  Filled 2019-12-23: qty 2

## 2019-12-23 MED ORDER — IBUPROFEN 400 MG PO TABS: 400.0000 mg | ORAL_TABLET | Freq: Four times a day (QID) | ORAL | 0 refills | Status: AC | PRN

## 2019-12-23 MED ORDER — ONDANSETRON HCL 4 MG PO TABS: 4.0000 mg | ORAL_TABLET | Freq: Three times a day (TID) | ORAL | 0 refills | Status: AC | PRN

## 2019-12-23 NOTE — Discharge Summary (Signed)
Physician Discharge Summary  Joseph Reith [id number] DOB: April 14, 1941 DOA: 12/21/2019  PCP: Prince Solian, MD  Admit date: 12/21/2019 Discharge date: 12/23/2019  Admitted From: Home Disposition:  Home  Recommendations for Outpatient Follow-up:  1. Follow up with PCP in 1-2 weeks 2. Follow up with Oncology as scheduled  Discharge Condition:Stable CODE STATUS:Full Diet recommendation: Regular   Brief/Interim Summary: 79 y.o.femalewith medical history significant ofGERD, hypothyroidism. Presents with several weeks of pruritis and juandice. She was at home in [facility] and became concerned about her condition. She was see by her PCP team and they recommended that she get some tests run. They found that she had hyperbilirubinemia and recommended that she ERCP performed. That procedure was not successful. So, her family recommended that she come to [facility] to have the procedure run here. She was seen by GI. ERCP was again unsuccessful; however, an EUS was positive for a pancreatic mass. GI discussed the situation with IR, who will place a drain today. [facility] was called for admission. She denies any aggravating or alleviating factors. She denies any previous history of cancer  Discharge Diagnoses:  Principal Problem:   Pancreatic mass Active Problems:   Jaundice   Hypothyroidism   GERD (gastroesophageal reflux disease)   HLD (hyperlipidemia)   Pruritus   Elevated LFTs  Pancreatic Mass Jaundice Elevated LFTs -Recently diagnosed with 1.8-1.9cm pancreatic head mass -Failed attempt at ERCP in [facility], subsequently presented to [place] where another attempt at ERCP failed. 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 Plan for pt to return to [facility] to begin treatment. Pt has  been seen and evaluated by Surgical Oncology as outpt  HLD - Currently holding home lipitor for right now d/t elevated LFTs -continue to follow LFT's  GERD -Continue on protonix as tolerated  Chronic hypotension - on chronic midodrine, continue for now -BP currently stable  Hypothyroidism - continue levothyroxine as tolerated   Discharge Instructions   Allergies as of 12/23/2019   No Known Allergies     Medication List    STOP taking these medications   atorvastatin 20 MG tablet Commonly known as: LIPITOR     TAKE these medications   amoxicillin-clavulanate 875-125 MG tablet Commonly known as: Augmentin Take 1 tablet by mouth every 12 (twelve) hours for 8 days.   CALTRATE 600+D3 PO Take 1 tablet by mouth daily.   ibuprofen 400 MG tablet Commonly known as: ADVIL Take 1 tablet (400 mg total) by mouth every 6 (six) hours as needed for fever, headache, mild pain or moderate pain.   levothyroxine 88 MCG tablet Commonly known as: SYNTHROID Take 88 mcg by mouth daily before breakfast.   Lubricant Eye Drops 0.4-0.3 % Soln Generic drug: Polyethyl Glycol-Propyl Glycol Place 1 drop into both eyes 3 (three) times daily as needed (dry/irritated eyes.).   midodrine 5 MG tablet Commonly known as: PROAMATINE Take 5 mg by mouth 3 (three) times daily.   ondansetron 4 MG tablet Commonly known as: Zofran Take 1 tablet (4 mg total) by mouth every 8 (eight) hours as needed for nausea or vomiting.   Pepcid AC 10 MG tablet Generic drug: famotidine Take 10 mg by mouth daily.   raloxifene 60 MG tablet Commonly known as: EVISTA Take 60 mg by mouth every evening.  Follow-up Information    Follow up with your PCP. Schedule an appointment as soon as possible for a visit.          No Known Allergies  Consultations:  IR GI  Oncology  Procedures/Studies: CT CHEST W CONTRAST  Result Date: 12/19/2019 CLINICAL DATA:  Recent diagnosis pancreatic cancer,  evaluate for metastatic disease EXAM: CT CHEST WITH CONTRAST TECHNIQUE: Multidetector CT imaging of the chest was performed during intravenous contrast administration. CONTRAST:  38mL ISOVUE-300 IOPAMIDOL (ISOVUE-300) INJECTION 61% COMPARISON:  None. FINDINGS: Cardiovascular: Minimal aortic atherosclerosis. Normal heart size. No pericardial effusion. Mediastinum/Nodes: No enlarged mediastinal, hilar, or axillary lymph nodes. Small hiatal hernia. Thyroid gland, trachea, and esophagus demonstrate no significant findings. Lungs/Pleura: Dependent bibasilar scarring and/or atelectasis. 3 mm nodule of the superior segment left lower lobe (series 8, image 53). 3 mm nodule of the dependent right lower lobe (series 8, image 58). No pleural effusion or pneumothorax. Upper Abdomen: No acute abnormality. Biliary and pancreatic ductal dilatation as well as gallbladder distension in the partially included upper abdomen. Cholelithiasis. Small 7 mm hyperenhancing lesion of the liver dome (series 2, image 87). Musculoskeletal: No chest wall mass or suspicious bone lesions identified. Disc degenerative disease of the thoracic spine IMPRESSION: 1. There is a 3 mm nodule of the superior segment left lower lobe (series 8, image 53) and a 3 mm nodule of the dependent right lower lobe (series 8, image 58). These are nonspecific and favored to represent incidental sequelae of prior infection or inflammation. Metastatic disease is less favored although not excluded. Attention on follow-up. 2. Biliary and pancreatic ductal dilatation as well as gallbladder distension in the partially included upper abdomen, findings in keeping with reported diagnosis of pancreatic cancer. 3. Aortic Atherosclerosis (ICD10-I70.0). Electronically Signed   By: Eddie Candle M.D.   On: 12/19/2019 16:38   DG C-Arm 1-60 Min-No Report  Result Date: 12/21/2019 Fluoroscopy was utilized by the requesting physician.  No radiographic interpretation.   IR BILIARY  DRAIN PLACEMENT WITH CHOLANGIOGRAM  Result Date: 12/21/2019 INDICATION: 79 year old with newly diagnosed pancreatic cancer and obstructive jaundice. Prior ERCP procedures were unable to decompress the biliary system. Plan for percutaneous transhepatic cholangiogram and placement of biliary drain. EXAM: PERCUTANEOUS TRANSHEPATIC CHOLANGIOGRAM WITH ULTRASOUND AND FLUOROSCOPIC GUIDANCE PLACEMENT OF INTERNAL/EXTERNAL BILIARY DRAIN MEDICATIONS: Zosyn 3.375 g; The antibiotic was administered within an appropriate time frame prior to the initiation of the procedure. ANESTHESIA/SEDATION: Moderate (conscious) sedation was employed during this procedure. A total of Versed 4.0 mg and Fentanyl 100 mcg was administered intravenously. Moderate Sedation Time: 47 minutes minutes. The patient's level of consciousness and vital signs were monitored continuously by radiology nursing throughout the procedure under my direct supervision. FLUOROSCOPY TIME:  Fluoroscopy Time: 10 minutes 12 seconds (125 mGy). COMPLICATIONS: None immediate. PROCEDURE: Informed written consent was obtained from the patient after a thorough discussion of the procedural risks, benefits and alternatives. All questions were addressed. Maximal Sterile Barrier Technique was utilized including caps, mask, sterile gowns, sterile gloves, sterile drape, hand hygiene and skin antiseptic. A timeout was performed prior to the initiation of the procedure. The anterior and right side of the abdomen was prepped and draped in a sterile fashion. Liver was evaluated with ultrasound. Skin was anesthetized with 1% lidocaine. Both left and right bile ducts were targeted with ultrasound guidance. The intrahepatic bile ducts were small and unable to successfully cannulate a bile duct in the left hepatic lobe. Using ultrasound guidance, 22 gauge needle was successfully advanced into the  right hepatic lobe and avoid the distended gallbladder. Needle was advanced centrally and the  needle was pulled back while injecting contrast. A peripheral duct was opacified with contrast and successfully cannulated using a 0.018 wire. Transitional dilator inner sheath was advanced over the wire and injected with contrast to confirm placement in the biliary system. Transitional dilator was then advanced into the common bile duct. Bentson wire was placed and a Kumpe catheter was advanced into the distal common bile duct. The biliary obstruction was successfully traversed using a Roadrunner wire. Catheter was advanced into the duodenum. Stiff Amplatz wire was placed. The tract was dilated to accommodate a 10 [place] biliary drain. Large amount of dark bile was aspirated from the biliary system. Final cholangiogram was performed through the catheter. Catheter was attached to a gravity bag. Catheter was sutured to skin. Bandage was placed at the drain site. FINDINGS: Ultrasound demonstrated a markedly distended gallbladder with stones. There was mild intrahepatic biliary dilatation based on ultrasound. Mildly dilated right intrahepatic bile duct was successfully cannulated. High-grade obstruction in the distal common bile duct was identified. Catheter and wire were able to traverse the obstruction. 90 [place] biliary drain extends across the obstruction with the tip in the duodenum. IMPRESSION: 1. Successful placement of an internal / external biliary drain. 2. High-grade obstruction in the distal common bile duct related to the pancreatic mass. Electronically Signed   By: Markus Daft M.D.   On: 12/21/2019 17:50     Subjective: Without abd pain or nausea  Discharge Exam: Vitals:   12/22/19 2214 12/23/19 0548  BP: 128/76 124/65  Pulse: 65 65  Resp: 16 16  Temp: 99.4 F (37.4 C) 98.2 F (36.8 C)  SpO2: 92% 93%   Vitals:   12/22/19 0652 12/22/19 1107 12/22/19 2214 12/23/19 0548  BP: (!) 116/56 (!) 119/54 128/76 124/65  Pulse: 73 74 65 65  Resp: 16 16 16 16   Temp: 98.5 F (36.9 C) 99.1 F (37.3  C) 99.4 F (37.4 C) 98.2 F (36.8 C)  TempSrc: Oral Oral Oral Oral  SpO2: 94% 95% 92% 93%  Weight:      Height:        General: Pt is alert, awake, not in acute distress Cardiovascular: RRR, S1/S2 +, no rubs, no gallops Respiratory: CTA bilaterally, no wheezing, no rhonchi Abdominal: Soft, NT, ND, bowel sounds + Extremities: no edema, no cyanosis   The results of significant diagnostics from this hospitalization (including imaging, microbiology, ancillary and laboratory) are listed below for reference.     Microbiology: Recent Results (from the past 240 hour(s))  SARS CORONAVIRUS 2 (TAT 6-24 HRS) Nasopharyngeal Nasopharyngeal Swab     Status: None   Collection Time: 12/19/19 10:59 AM   Specimen: Nasopharyngeal Swab  Result Value Ref Range Status   SARS Coronavirus 2 NEGATIVE NEGATIVE Final    Comment: (NOTE) SARS-CoV-2 target nucleic acids are NOT DETECTED. The SARS-CoV-2 RNA is generally detectable in upper and lower respiratory specimens during the acute phase of infection. Negative results do not preclude SARS-CoV-2 infection, do not rule out co-infections with other pathogens, and should not be used as the sole basis for treatment or other patient management decisions. Negative results must be combined with clinical observations, patient history, and epidemiological information. The expected result is Negative. Fact Sheet for Patients: SugarRoll.be Fact Sheet for Healthcare Providers: https://www.woods-mathews.com/ This test is not yet approved or cleared by the [place] FDA and  has been authorized for detection and/or diagnosis of SARS-CoV-2  by FDA under an Emergency Use Authorization (EUA). This EUA will remain  in effect (meaning this test can be used) for the duration of the COVID-19 declaration under Section 56 4(b)(1) of the Act, 21 U.S.C. section 360bbb-3(b)(1), unless the authorization is terminated or revoked  sooner. Performed at [facility], [place] [address] [place], [place] [address]      Labs: BNP (last 3 results) No results for input(s): BNP in the last 8760 hours. Basic Metabolic Panel: Recent Labs  Lab 12/21/19 0855 12/22/19 0403 12/23/19 0355  NA 139 140 140  K 3.5 3.5 3.3*  CL 107 106 106  CO2 21* 24 25  GLUCOSE 97 112* 95  BUN 13 15 12   CREATININE 0.52 0.84 0.85  CALCIUM 8.1* 8.1* 8.0*   Liver Function Tests: Recent Labs  Lab 12/21/19 0855 12/22/19 0403 12/23/19 0355  AST 269* 396* 399*  ALT 578* 760* 814*  ALKPHOS 539* 560* 486*  BILITOT 13.1* 8.4* 6.7*  PROT 5.1* 5.6* 5.6*  ALBUMIN 2.5* 2.7* 2.6*   No results for input(s): LIPASE, AMYLASE in the last 168 hours. No results for input(s): AMMONIA in the last 168 hours. CBC: Recent Labs  Lab 12/22/19 0403 12/23/19 0355  WBC 13.6* 12.1*  HGB 10.8* 10.5*  HCT 33.4* 33.1*  MCV 91.0 92.5  PLT 165 167   Cardiac Enzymes: No results for input(s): CKTOTAL, CKMB, CKMBINDEX, TROPONINI in the last 168 hours. BNP: Invalid input(s): POCBNP CBG: No results for input(s): GLUCAP in the last 168 hours. D-Dimer No results for input(s): DDIMER in the last 72 hours. Hgb A1c No results for input(s): HGBA1C in the last 72 hours. Lipid Profile No results for input(s): CHOL, HDL, LDLCALC, TRIG, CHOLHDL, LDLDIRECT in the last 72 hours. Thyroid function studies Recent Labs    12/22/19 0403  TSH 0.533   Anemia work up No results for input(s): VITAMINB12, FOLATE, FERRITIN, TIBC, IRON, RETICCTPCT in the last 72 hours. Urinalysis No results found for: COLORURINE, APPEARANCEUR, [place], [place], [place], [place], [place], [place], PROTEINUR, UROBILINOGEN, NITRITE, LEUKOCYTESUR Sepsis Labs Invalid input(s): PROCALCITONIN,  WBC,  LACTICIDVEN Microbiology Recent Results (from the past 240 hour(s))  SARS CORONAVIRUS 2 (TAT 6-24 HRS) Nasopharyngeal Nasopharyngeal Swab     Status: None   Collection Time: 12/19/19  10:59 AM   Specimen: Nasopharyngeal Swab  Result Value Ref Range Status   SARS Coronavirus 2 NEGATIVE NEGATIVE Final    Comment: (NOTE) SARS-CoV-2 target nucleic acids are NOT DETECTED. The SARS-CoV-2 RNA is generally detectable in upper and lower respiratory specimens during the acute phase of infection. Negative results do not preclude SARS-CoV-2 infection, do not rule out co-infections with other pathogens, and should not be used as the sole basis for treatment or other patient management decisions. Negative results must be combined with clinical observations, patient history, and epidemiological information. The expected result is Negative. Fact Sheet for Patients: SugarRoll.be Fact Sheet for Healthcare Providers: https://www.woods-mathews.com/ This test is not yet approved or cleared by the [place] FDA and  has been authorized for detection and/or diagnosis of SARS-CoV-2 by FDA under an Emergency Use Authorization (EUA). This EUA will remain  in effect (meaning this test can be used) for the duration of the COVID-19 declaration under Section 56 4(b)(1) of the Act, 21 U.S.C. section 360bbb-3(b)(1), unless the authorization is terminated or revoked sooner. Performed at [facility], [place] [address], [place], [place] [address]    Time spent: 30 min  SIGNED:   Marylu Lund,  MD  [facility] 12/23/2019, 9:42 AM  If 7PM-7AM, please contact night-coverage

## 2019-12-23 NOTE — Progress Notes (Signed)
Discharge instructions discussed with patient and family, verbalized agreement and understanding 

## 2019-12-25 LAB — CYTOLOGY - NON PAP

## 2020-11-22 IMAGING — CT CT CHEST W/ CM
2 of 4 series · 11 of 36 positions shown, 13 images · IV contrast (iopamidol)
Comparison: None.

CLINICAL DATA: Recent diagnosis pancreatic cancer, evaluate for
metastatic disease

EXAM:
CT CHEST WITH CONTRAST
TECHNIQUE: Multidetector CT imaging of the chest was performed during
intravenous contrast administration.
CONTRAST:  75mL A4XRQ2-AWW IOPAMIDOL (A4XRQ2-AWW) INJECTION 61%

[Series 2: chest 2.00 br40 s3 · axial · 0.56mm/px · z∈[+1499,+1739]mm · 8 of 142 slices shown, 10 images (1 of 2)]
[im 11/142  mediastinal]
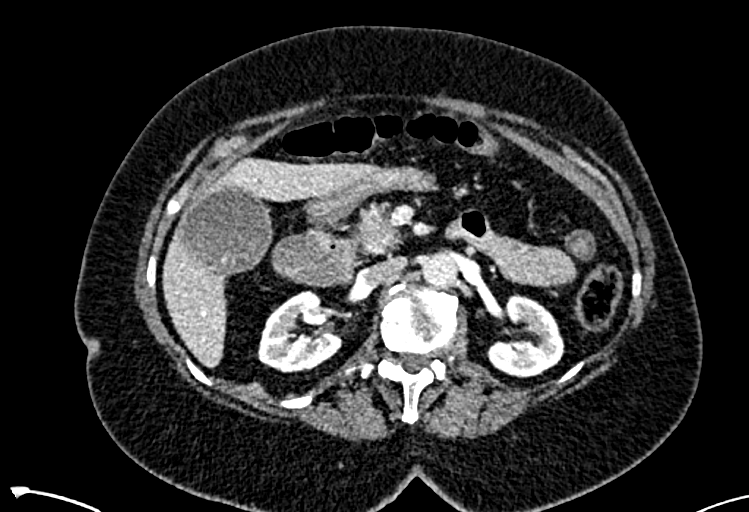
[im 11/142  lung]
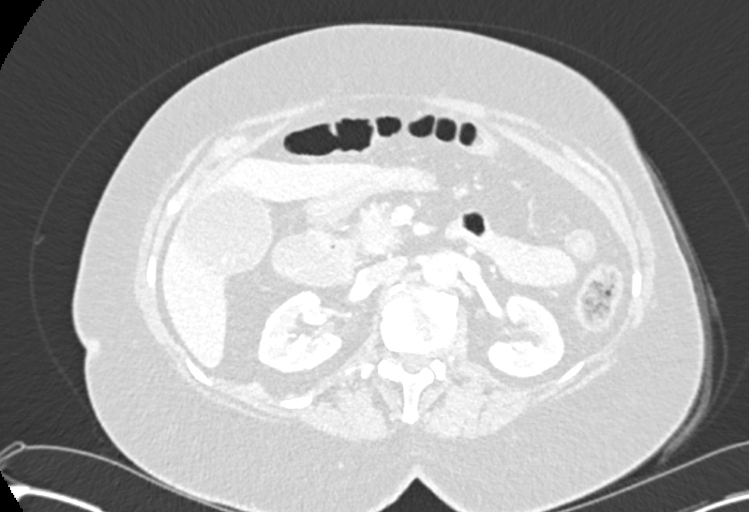
[im 33/142  lung]
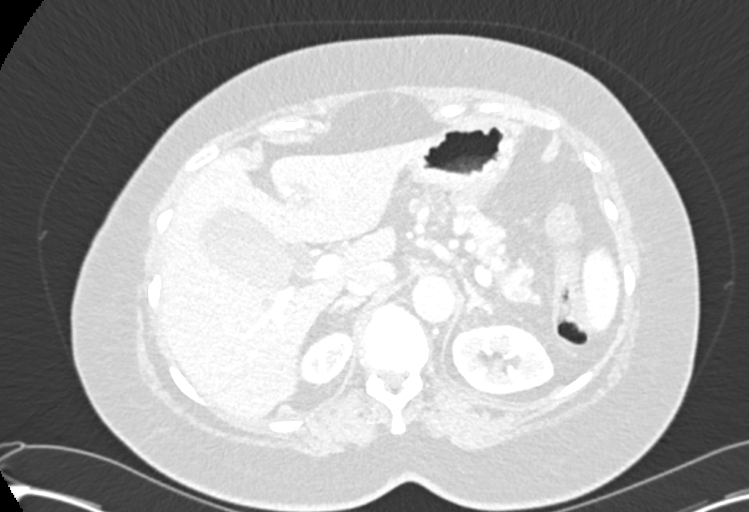
[im 44/142  lung]
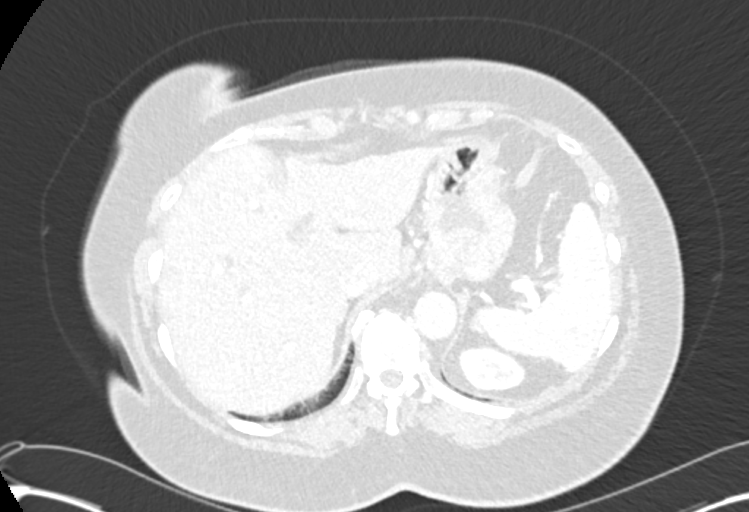
[im 66/142  lung]
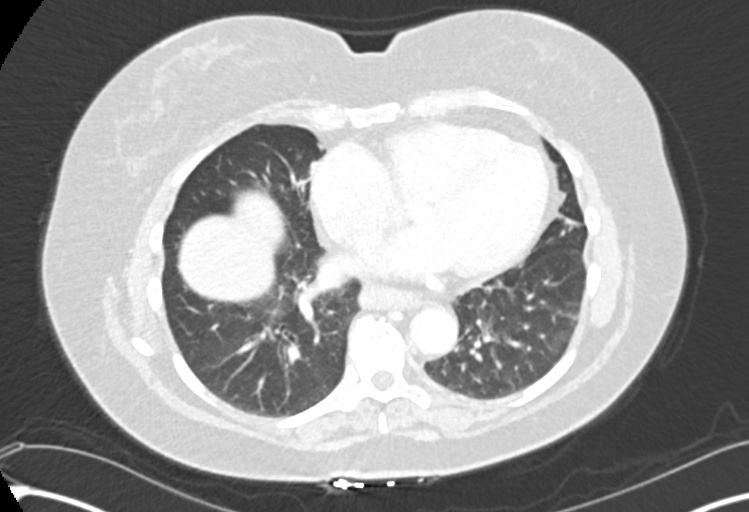
[im 76/142  mediastinal]
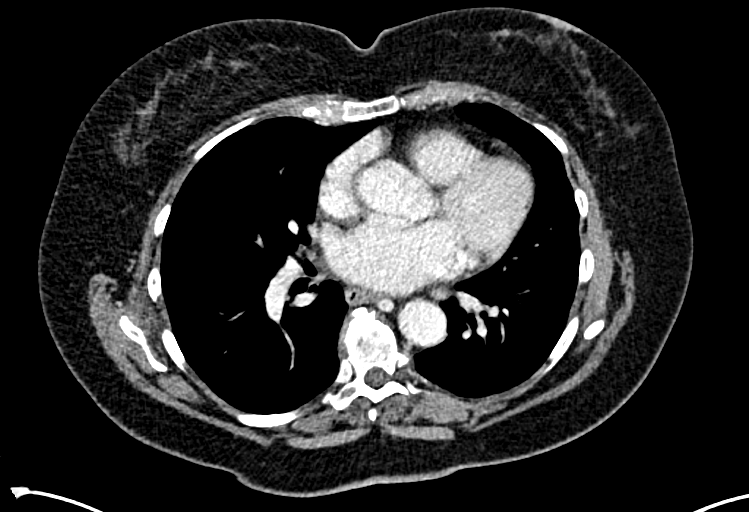
[im 76/142  lung]
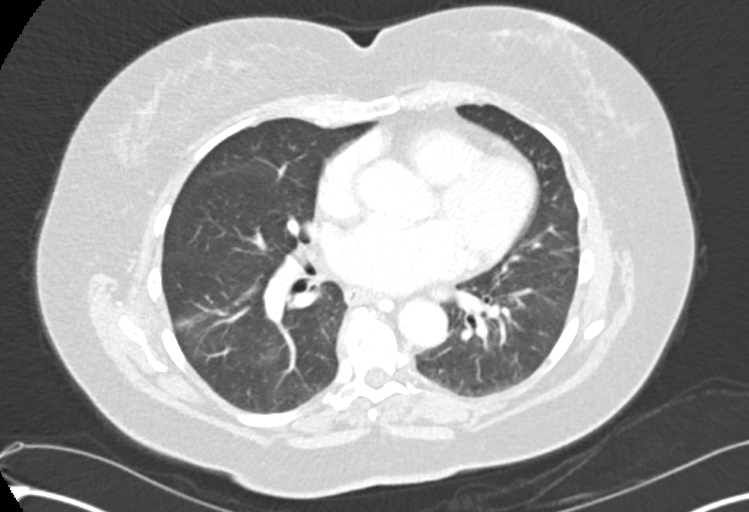
[im 98/142  lung]
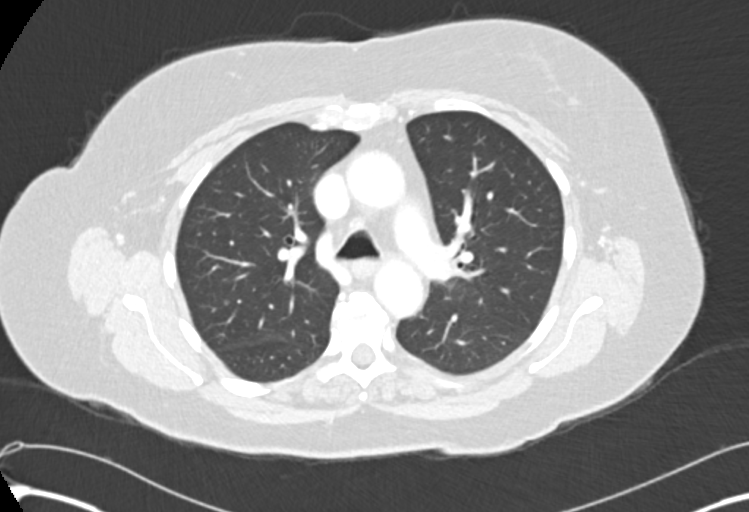
[im 109/142  lung]
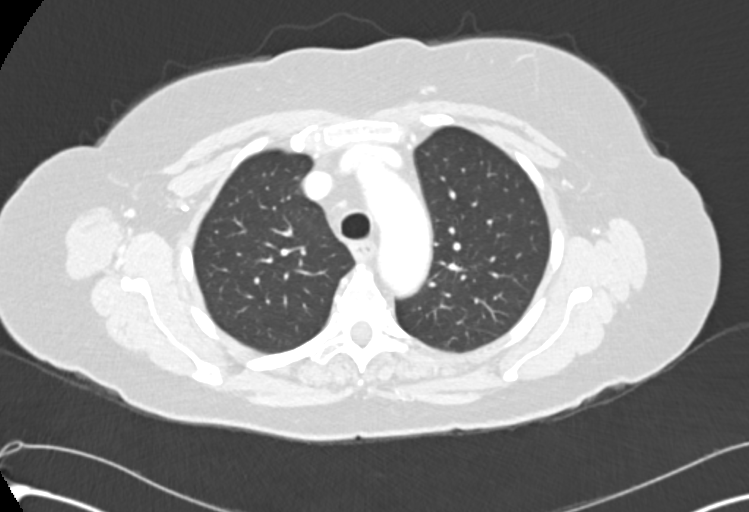
[im 131/142  lung]
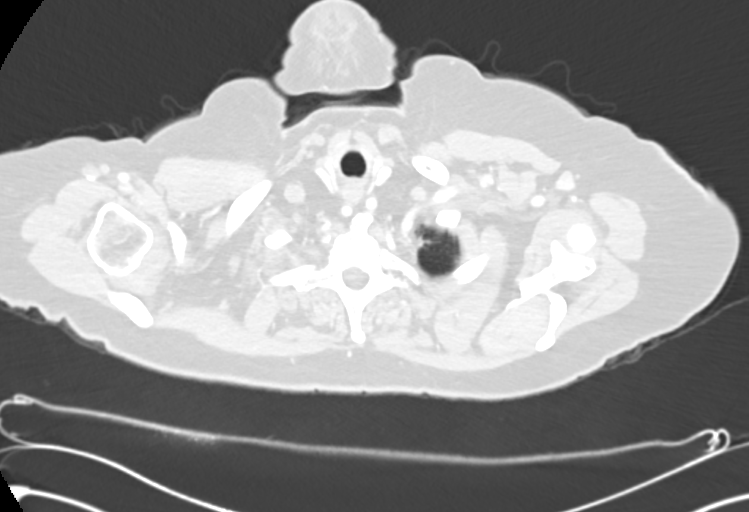

[Series 4: chest 2.00 br40 s3 · coronal · 0.56mm/px · 3 of 143 slices shown (2 of 2)]
[im 29/143  lung]
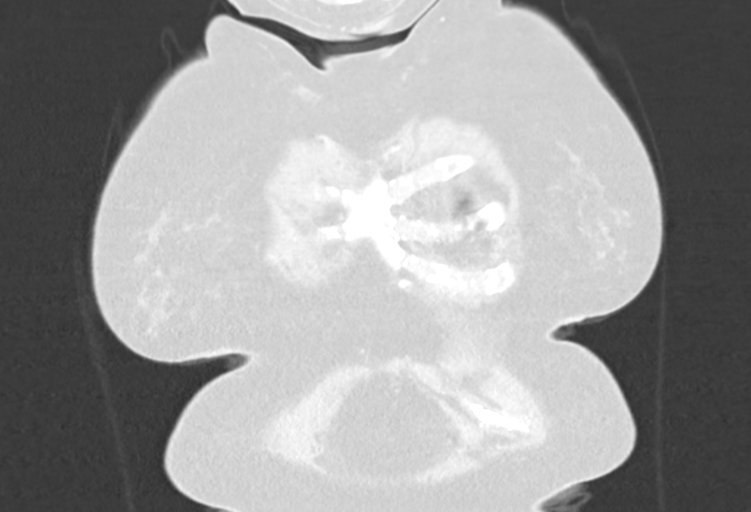
[im 57/143  lung]
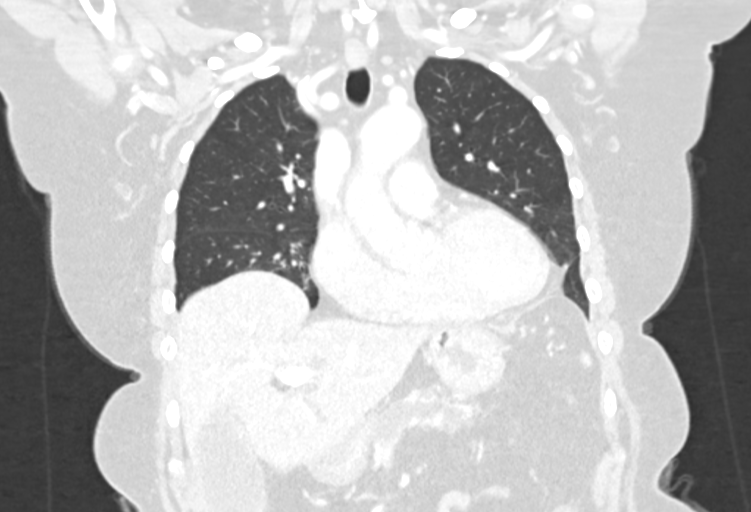
[im 86/143  lung]
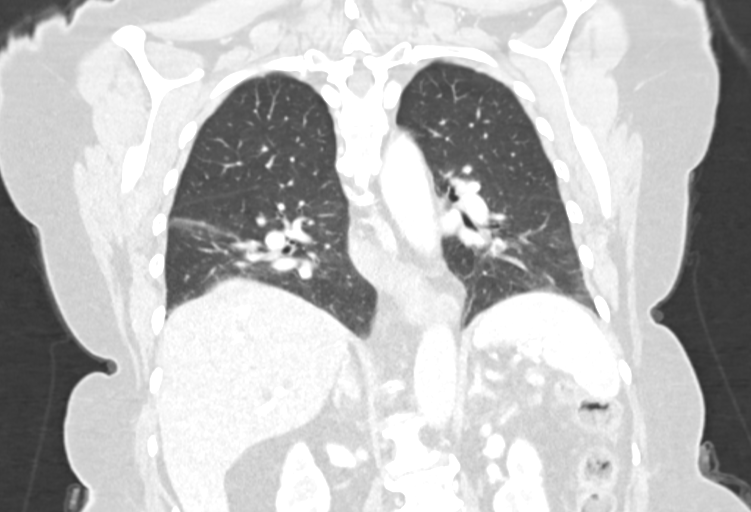

[11 of 36 positions shown; findings below may reference images not displayed]

FINDINGS: Cardiovascular: Minimal aortic atherosclerosis. Normal heart size.
No pericardial effusion.

Mediastinum/Nodes: No enlarged mediastinal, hilar, or axillary lymph
nodes. Small hiatal hernia. Thyroid gland, trachea, and esophagus
demonstrate no significant findings.

Lungs/Pleura: Dependent bibasilar scarring and/or atelectasis. 3 mm
nodule of the superior segment left lower lobe (series 8, image 53).
3 mm nodule of the dependent right lower lobe (series 8, image 58).
No pleural effusion or pneumothorax.

Upper Abdomen: No acute abnormality. Biliary and pancreatic ductal
dilatation as well as gallbladder distension in the partially
included upper abdomen. Cholelithiasis. Small 7 mm hyperenhancing
lesion of the liver dome (series 2, image 87).

Musculoskeletal: No chest wall mass or suspicious bone lesions
identified. Disc degenerative disease of the thoracic spine
IMPRESSION: 1. There is a 3 mm nodule of the superior segment left lower lobe
(series 8, image 53) and a 3 mm nodule of the dependent right lower
lobe (series 8, image 58). These are nonspecific and favored to
represent incidental sequelae of prior infection or inflammation.
Metastatic disease is less favored although not excluded. Attention
on follow-up.
2. Biliary and pancreatic ductal dilatation as well as gallbladder
distension in the partially included upper abdomen, findings in
keeping with reported diagnosis of pancreatic cancer.
3. Aortic Atherosclerosis ([id number]).

## 2021-07-21 DEATH — deceased

## 2021-08-21 DEATH — deceased
